# Patient Record
Sex: Male | Born: 1950 | Race: White | Hispanic: No | Marital: Married | Smoking: Former smoker
Health system: Southern US, Community
[De-identification: ages and names within clinical notes are randomized; demographics above are authoritative.]

## PROBLEM LIST (undated history)

## (undated) DIAGNOSIS — M419 Scoliosis, unspecified: Secondary | ICD-10-CM

## (undated) DIAGNOSIS — N200 Calculus of kidney: Secondary | ICD-10-CM

## (undated) DIAGNOSIS — R7989 Other specified abnormal findings of blood chemistry: Secondary | ICD-10-CM

## (undated) DIAGNOSIS — I10 Essential (primary) hypertension: Secondary | ICD-10-CM

## (undated) DIAGNOSIS — R0602 Shortness of breath: Secondary | ICD-10-CM

## (undated) DIAGNOSIS — G629 Polyneuropathy, unspecified: Secondary | ICD-10-CM

## (undated) DIAGNOSIS — R0789 Other chest pain: Secondary | ICD-10-CM

## (undated) DIAGNOSIS — M1990 Unspecified osteoarthritis, unspecified site: Secondary | ICD-10-CM

## (undated) DIAGNOSIS — C44300 Unspecified malignant neoplasm of skin of unspecified part of face: Secondary | ICD-10-CM

## (undated) DIAGNOSIS — F419 Anxiety disorder, unspecified: Secondary | ICD-10-CM

## (undated) DIAGNOSIS — G20 Parkinson's disease: Secondary | ICD-10-CM

## (undated) HISTORY — PX: SUBTHALAMIC STIMULATOR BATTERY REPLACEMENT: SHX5405

## (undated) HISTORY — PX: HEMORROIDECTOMY: SUR656

## (undated) HISTORY — PX: TONSILLECTOMY: SUR1361

## (undated) HISTORY — DX: Polyneuropathy, unspecified: G62.9

## (undated) HISTORY — PX: BACK SURGERY: SHX140

## (undated) HISTORY — DX: Anxiety disorder, unspecified: F41.9

## (undated) HISTORY — PX: BURR HOLE W/ STEREOTACTIC INSERTION OF DBS LEADS / INTRAOP MICROELECTRODE RECORDING: SUR171

## (undated) HISTORY — DX: Calculus of kidney: N20.0

## (undated) HISTORY — DX: Unspecified osteoarthritis, unspecified site: M19.90

## (undated) HISTORY — DX: Essential (primary) hypertension: I10

## (undated) HISTORY — DX: Parkinson's disease: G20

## (undated) HISTORY — DX: Scoliosis, unspecified: M41.9

## (undated) HISTORY — PX: UPPER GI ENDOSCOPY: SHX6162

## (undated) HISTORY — PX: COLONOSCOPY: SHX5424

---

## 2004-03-05 ENCOUNTER — Ambulatory Visit: Payer: Self-pay

## 2004-05-15 ENCOUNTER — Ambulatory Visit: Payer: Self-pay | Admitting: Pain Medicine

## 2004-05-27 ENCOUNTER — Ambulatory Visit: Payer: Self-pay | Admitting: Pain Medicine

## 2004-06-10 ENCOUNTER — Ambulatory Visit: Payer: Self-pay | Admitting: Pain Medicine

## 2004-06-23 ENCOUNTER — Ambulatory Visit: Payer: Self-pay | Admitting: Physician Assistant

## 2004-10-30 ENCOUNTER — Ambulatory Visit: Payer: Self-pay | Admitting: Family Medicine

## 2004-11-13 ENCOUNTER — Ambulatory Visit: Payer: Self-pay | Admitting: Pain Medicine

## 2004-11-28 ENCOUNTER — Ambulatory Visit: Payer: Self-pay | Admitting: Physician Assistant

## 2004-12-09 ENCOUNTER — Ambulatory Visit: Payer: Self-pay | Admitting: Pain Medicine

## 2005-01-08 ENCOUNTER — Ambulatory Visit: Payer: Self-pay | Admitting: Pain Medicine

## 2005-01-29 ENCOUNTER — Ambulatory Visit: Payer: Self-pay | Admitting: Pain Medicine

## 2005-03-03 ENCOUNTER — Ambulatory Visit: Payer: Self-pay | Admitting: Pain Medicine

## 2005-03-17 ENCOUNTER — Ambulatory Visit: Payer: Self-pay | Admitting: Pain Medicine

## 2005-03-25 ENCOUNTER — Ambulatory Visit: Payer: Self-pay | Admitting: Pain Medicine

## 2005-04-01 ENCOUNTER — Ambulatory Visit: Payer: Self-pay | Admitting: Pain Medicine

## 2005-06-08 ENCOUNTER — Ambulatory Visit: Payer: Self-pay | Admitting: Pain Medicine

## 2006-05-18 ENCOUNTER — Ambulatory Visit: Payer: Self-pay | Admitting: Internal Medicine

## 2006-06-07 ENCOUNTER — Ambulatory Visit: Payer: Self-pay | Admitting: Internal Medicine

## 2006-09-07 ENCOUNTER — Ambulatory Visit: Payer: Self-pay | Admitting: Gastroenterology

## 2006-11-30 ENCOUNTER — Ambulatory Visit: Payer: Self-pay | Admitting: Internal Medicine

## 2006-12-22 ENCOUNTER — Ambulatory Visit: Payer: Self-pay | Admitting: Internal Medicine

## 2007-05-02 ENCOUNTER — Ambulatory Visit: Payer: Self-pay | Admitting: Urology

## 2007-05-02 ENCOUNTER — Ambulatory Visit: Payer: Self-pay

## 2007-05-06 ENCOUNTER — Ambulatory Visit: Payer: Self-pay | Admitting: Internal Medicine

## 2007-07-28 ENCOUNTER — Ambulatory Visit: Payer: Self-pay | Admitting: Internal Medicine

## 2007-07-29 ENCOUNTER — Ambulatory Visit: Payer: Self-pay | Admitting: Internal Medicine

## 2007-08-01 ENCOUNTER — Ambulatory Visit: Payer: Self-pay | Admitting: Internal Medicine

## 2007-09-22 ENCOUNTER — Ambulatory Visit: Payer: Self-pay | Admitting: Vascular Surgery

## 2007-10-31 ENCOUNTER — Ambulatory Visit: Payer: Self-pay | Admitting: Internal Medicine

## 2008-02-27 ENCOUNTER — Ambulatory Visit: Payer: Self-pay | Admitting: Internal Medicine

## 2008-03-15 ENCOUNTER — Ambulatory Visit: Payer: Self-pay | Admitting: Internal Medicine

## 2008-04-06 ENCOUNTER — Ambulatory Visit: Payer: Self-pay | Admitting: Vascular Surgery

## 2008-08-28 ENCOUNTER — Inpatient Hospital Stay (HOSPITAL_COMMUNITY): Admission: RE | Admit: 2008-08-28 | Discharge: 2008-08-30 | Payer: Self-pay | Admitting: Neurosurgery

## 2008-09-17 ENCOUNTER — Emergency Department: Payer: Self-pay | Admitting: Emergency Medicine

## 2008-11-02 ENCOUNTER — Ambulatory Visit: Payer: Self-pay | Admitting: Surgery

## 2008-11-09 ENCOUNTER — Ambulatory Visit: Payer: Self-pay | Admitting: Surgery

## 2009-01-07 ENCOUNTER — Ambulatory Visit: Payer: Self-pay | Admitting: Neurosurgery

## 2009-01-29 ENCOUNTER — Other Ambulatory Visit: Payer: Self-pay | Admitting: Physician Assistant

## 2009-02-15 ENCOUNTER — Ambulatory Visit: Payer: Self-pay | Admitting: Neurosurgery

## 2009-03-12 ENCOUNTER — Encounter: Admission: RE | Admit: 2009-03-12 | Discharge: 2009-03-12 | Payer: Self-pay | Admitting: Neurosurgery

## 2009-07-09 ENCOUNTER — Other Ambulatory Visit: Payer: Self-pay | Admitting: Internal Medicine

## 2009-08-16 ENCOUNTER — Ambulatory Visit: Payer: Self-pay | Admitting: Vascular Surgery

## 2010-01-15 ENCOUNTER — Other Ambulatory Visit: Payer: Self-pay | Admitting: Internal Medicine

## 2010-01-15 LAB — CBC WITH DIFFERENTIAL/PLATELET
Basophil #: 0 x10 (ref 0.0–0.1)
Basophil %: 0.5 %
Eosinophil #: 0.1 x10 (ref 0.0–0.7)
HCT: 47.1 % (ref 40.0–52.0)
HGB: 16.1 g/dL (ref 13.0–18.0)
Lymphocyte #: 1.5 x10 (ref 1.0–3.6)
Lymphocyte %: 33.1 %
MCH: 31 pg (ref 26.0–34.0)
MCHC: 34.2 g/dL (ref 32.0–36.0)
Monocyte #: 0.5 x10 (ref 0.0–0.7)
Neutrophil #: 2.5 x10 (ref 1.4–6.5)
Platelet: 169 x10 (ref 150–440)
RDW: 13.6 % (ref 11.5–14.5)

## 2010-01-15 LAB — COMPREHENSIVE METABOLIC PANEL
Albumin: 3.9 g/dL (ref 3.4–5.0)
Anion Gap: 10 (ref 7–16)
BUN: 19 mg/dL — ABNORMAL HIGH (ref 7–18)
Chloride: 101 mmol/L (ref 98–107)
Co2: 33 mmol/L — ABNORMAL HIGH (ref 21–32)
EGFR (African American): >60
EGFR (Non-African Amer.): >60
Glucose: 92 mg/dL (ref 65–99)
SGOT(AST): 18 U/L (ref 15–37)
SGPT (ALT): <6 U/L — ABNORMAL LOW (ref 30–79)

## 2010-01-15 LAB — LIPID PANEL
HDL Cholesterol: 60 mg/dL (ref 40–60)
Ldl Cholesterol, Calc: 96 mg/dL (ref 0–100)
Triglycerides: 61 mg/dL (ref 0–200)
VLDL Cholesterol, Calc: 12 mg/dL (ref 5–40)

## 2010-01-15 LAB — TSH: Thyroid Stimulating Horm: 2.41 mcIU/mL

## 2010-01-16 LAB — PSA

## 2010-02-26 ENCOUNTER — Ambulatory Visit: Payer: Self-pay | Admitting: Neurosurgery

## 2010-03-04 ENCOUNTER — Ambulatory Visit: Payer: Self-pay | Admitting: Pain Medicine

## 2010-03-17 ENCOUNTER — Ambulatory Visit: Payer: Self-pay | Admitting: Pain Medicine

## 2010-03-18 ENCOUNTER — Ambulatory Visit: Payer: Self-pay | Admitting: Pain Medicine

## 2010-04-02 ENCOUNTER — Ambulatory Visit: Payer: Self-pay | Admitting: Pain Medicine

## 2010-04-03 ENCOUNTER — Ambulatory Visit: Payer: Self-pay | Admitting: Pain Medicine

## 2010-04-22 ENCOUNTER — Inpatient Hospital Stay (HOSPITAL_COMMUNITY)
Admission: RE | Admit: 2010-04-22 | Discharge: 2010-04-23 | Payer: Self-pay | Source: Home / Self Care | Admitting: Neurosurgery

## 2010-05-30 ENCOUNTER — Ambulatory Visit: Payer: Self-pay | Admitting: Neurosurgery

## 2010-06-17 ENCOUNTER — Ambulatory Visit: Payer: Self-pay | Admitting: Pain Medicine

## 2010-06-21 ENCOUNTER — Encounter: Payer: Self-pay | Admitting: Neurosurgery

## 2010-06-30 ENCOUNTER — Ambulatory Visit: Payer: Self-pay | Admitting: Pain Medicine

## 2010-07-15 ENCOUNTER — Other Ambulatory Visit: Payer: Self-pay | Admitting: Internal Medicine

## 2010-07-18 ENCOUNTER — Ambulatory Visit: Payer: Self-pay | Admitting: Internal Medicine

## 2010-07-30 ENCOUNTER — Encounter: Payer: Self-pay | Admitting: Neurology

## 2010-07-31 ENCOUNTER — Encounter: Payer: Self-pay | Admitting: Neurology

## 2010-08-12 LAB — PROTIME-INR
INR: 1.06 (ref 0.00–1.49)
Prothrombin Time: 14 seconds (ref 11.6–15.2)

## 2010-08-12 LAB — APTT: aPTT: 30 seconds (ref 24–37)

## 2010-08-12 LAB — SURGICAL PCR SCREEN: MRSA, PCR: NEGATIVE

## 2010-08-12 LAB — URINALYSIS, ROUTINE W REFLEX MICROSCOPIC
Ketones, ur: 15 mg/dL — AB
Nitrite: NEGATIVE
Protein, ur: NEGATIVE mg/dL
Specific Gravity, Urine: 1.024 (ref 1.005–1.030)
Urobilinogen, UA: 0.2 mg/dL (ref 0.0–1.0)

## 2010-08-12 LAB — COMPREHENSIVE METABOLIC PANEL
Alkaline Phosphatase: 82 U/L (ref 39–117)
BUN: 21 mg/dL (ref 6–23)
Calcium: 9.9 mg/dL (ref 8.4–10.5)
GFR calc non Af Amer: 50 mL/min — ABNORMAL LOW (ref >60)
Glucose, Bld: 104 mg/dL — ABNORMAL HIGH (ref 70–99)
Potassium: 3.6 mEq/L (ref 3.5–5.1)
Total Protein: 7.1 g/dL (ref 6.0–8.3)

## 2010-08-12 LAB — DIFFERENTIAL
Basophils Relative: 0 % (ref 0–1)
Monocytes Relative: 11 % (ref 3–12)
Neutro Abs: 4.1 10*3/uL (ref 1.7–7.7)
Neutrophils Relative %: 63 % (ref 43–77)

## 2010-08-12 LAB — CBC
HCT: 46.1 % (ref 39.0–52.0)
MCHC: 34.7 g/dL (ref 30.0–36.0)
MCV: 88.3 fL (ref 78.0–100.0)
RDW: 13.1 % (ref 11.5–15.5)

## 2010-08-31 ENCOUNTER — Encounter: Payer: Self-pay | Admitting: Neurology

## 2010-09-11 LAB — URINALYSIS, ROUTINE W REFLEX MICROSCOPIC
Bilirubin Urine: NEGATIVE
Ketones, ur: 15 mg/dL — AB
Nitrite: NEGATIVE
Specific Gravity, Urine: 1.023 (ref 1.005–1.030)
Urobilinogen, UA: 0.2 mg/dL (ref 0.0–1.0)
pH: 6 (ref 5.0–8.0)

## 2010-09-11 LAB — ABO/RH: ABO/RH(D): O POS

## 2010-09-11 LAB — DIFFERENTIAL
Basophils Absolute: 0 10*3/uL (ref 0.0–0.1)
Lymphocytes Relative: 34 % (ref 12–46)
Monocytes Absolute: 0.5 10*3/uL (ref 0.1–1.0)
Monocytes Relative: 10 % (ref 3–12)
Neutro Abs: 2.5 10*3/uL (ref 1.7–7.7)
Neutrophils Relative %: 54 % (ref 43–77)

## 2010-09-11 LAB — URINE MICROSCOPIC-ADD ON

## 2010-09-11 LAB — CBC
HCT: 47 % (ref 39.0–52.0)
Hemoglobin: 16.5 g/dL (ref 13.0–17.0)
MCHC: 35 g/dL (ref 30.0–36.0)
MCV: 91.8 fL (ref 78.0–100.0)
Platelets: 194 10*3/uL (ref 150–400)
RDW: 12.5 % (ref 11.5–15.5)

## 2010-09-11 LAB — COMPREHENSIVE METABOLIC PANEL
Albumin: 3.9 g/dL (ref 3.5–5.2)
BUN: 13 mg/dL (ref 6–23)
Creatinine, Ser: 1.27 mg/dL (ref 0.4–1.5)
Glucose, Bld: 98 mg/dL (ref 70–99)
Total Protein: 6.4 g/dL (ref 6.0–8.3)

## 2010-09-11 LAB — APTT: aPTT: 32 seconds (ref 24–37)

## 2010-09-11 LAB — PROTIME-INR: INR: 1.1 (ref 0.00–1.49)

## 2010-09-11 LAB — TYPE AND SCREEN: ABO/RH(D): O POS

## 2010-10-14 NOTE — H&P (Signed)
Hunter Brown, Hunter Brown                   ACCOUNT NO.:  [id number]   MEDICAL RECORD NO.:  [id number]          PATIENT TYPE:  INP   LOCATION:  3008                         FACILITY:  [facility]   PHYSICIAN:  Payton Doughty, M.D.      DATE OF BIRTH:  April 25, 1951   DATE OF ADMISSION:  08/28/2008  DATE OF DISCHARGE:                              HISTORY & PHYSICAL   ADMISSION DIAGNOSIS:  Spondylosis at L3-4.   BODY OF TEXT:  A very nice 60 year old right-handed white gentleman we  had seen several years ago with pain in his back.  He has had increased  pain in back down his left leg.  Myelogram in November demonstrated disk  bulging at L3-4.  He subsequently had an epidural injection that did not  help at L4-5, and then got a facet block at L3-4 on the left and this  provided relief transiently for about as long as the lidocaine was  there.  We assume then that the pathologic levels are L3-4 and he is now  here for an anterolateral interbody fusion at L3-4.  Medical history is  remarkable for Parkinson disease.  He has a deep brain stimulator and he  takes Stalevo, amantadine, Azilect, Colace, Flexeril, Flonase, and  Xanax.  He has no allergies.   SURGICAL HISTORY:  Brain stimulator, tonsillectomy, and hemorrhoids.   SOCIAL HISTORY:  Does not smoke, does not drink, is on disability.   FAMILY HISTORY:  Mom died at 53.  Dad died at 55 of cardiac disease and  Alzheimer's.   REVIEW OF SYSTEMS:  Remarkable for glasses, swelling in hands and feet,  leg pain, shortness of breath, skin cancer, and fainting spells.   PHYSICAL EXAMINATION:  HEENT:  Within normal limits.  NECK:  He has reasonable range of motion in neck.  CHEST:  Clear.  CARDIAC:  Regular rate and rhythm.  ABDOMEN:  Nontender with no hepatosplenomegaly.  EXTREMITIES:  Without clubbing or cyanosis.  Peripheral pulses are good.  GU:  Deferred.  NEUROLOGICAL:  He is awake, alert, and oriented.  Cranial nerves are  intact.  Motor exam shows  5/5 strength throughout the upper and lower  extremities.  Pulling causes back pain, pushing does not.  Reflexes are  1 at the knees and 1 at the ankles.   Radiographic results have been reviewed above.   CLINICAL IMPRESSION:  Lumbar spondylosis at L3-4.   PLAN:  L3-4 XLIF.  The risks and benefits have been discussed with him.  He wished to proceed.            ______________________________  Payton Doughty, M.D.     MWR/MEDQ  D:  08/28/2008  T:  08/29/2008  Job:  [id number]

## 2010-10-14 NOTE — Op Note (Signed)
Hunter Brown, Hunter Brown                   ACCOUNT NO.:  [id number]   MEDICAL RECORD NO.:  [id number]          PATIENT TYPE:  INP   LOCATION:  3008                         FACILITY:  [facility]   PHYSICIAN:  Payton Doughty, M.D.      DATE OF BIRTH:  12-05-1950   DATE OF PROCEDURE:  08/28/2008  DATE OF DISCHARGE:                               OPERATIVE REPORT   PREOPERATIVE DIAGNOSIS:  Spondylosis L3-4.   POSTOPERATIVE DIAGNOSIS:  Spondylosis L3-4.   OPERATIVE PROCEDURE:  L3-4 anterolateral interbody fusion using XLIF  technique.   SURGEON:  Payton Doughty, MD.   ANESTHESIA:  General endotracheal.   PREPARATION:  Prepped and draped with alcohol wipe.   COMPLICATIONS:  None.   NURSE ASSISTANT:  [place].   DOCTOR ASSISTANT:  Coletta Memos, MD   BODY OF TEXT:  A 60 year old gentleman with severe spondylosis and  lateral slip at L3-4 taken to operative room, smoothly anesthetized,  intubated, and placed in the right side down left side up lateral  decubitus position.  The table flexed so that fluoroscopic visualization  of the L3-4 interspace was achieved.  Following shave, prep, and drape  in usual sterile fashion, 2 incisions were made, a small posterior  incision and one directly lateral that was about 3 cm long.  Through  these incisions, the retroperitoneal space was accessed.  The dilator  probe was passed through the lateral incision down to the psoas muscle  and to the intervertebral space at L3-4.  Electromonitoring was carried  out to ensure that there was no injury to the nerve roots or pelvic  plexus.  Following successive dilation, a retractor was placed, the shim  tapped into the disk space.  Diskectomy was carried out with a variety  of instrumentation and the endplates prepared.  A 10-mm x 55-mm  interbody graft with Osteocel was placed.  The lateral plate was placed  with screws in L3 and L4.  This was also done under fluoroscopic  control.  The locking caps were placed and  tightened.  Intraoperative  fluoro showed good placement of interbody device, screws, and lateral  plate.  A 0 Vicryl and 3-0 nylon were used to close.  Betadine and Telfa  dressing were applied and the patient returned to recovery room in good  condition.            ______________________________  Payton Doughty, M.D.    MWR/MEDQ  D:  08/28/2008  T:  08/29/2008  Job:  [id number]

## 2010-11-04 ENCOUNTER — Other Ambulatory Visit: Payer: Self-pay | Admitting: Neurosurgery

## 2010-11-04 DIAGNOSIS — M545 Low back pain: Secondary | ICD-10-CM

## 2010-11-19 ENCOUNTER — Ambulatory Visit: Payer: Self-pay | Admitting: Neurosurgery

## 2011-01-20 ENCOUNTER — Ambulatory Visit: Payer: Self-pay | Admitting: Pain Medicine

## 2011-01-27 ENCOUNTER — Ambulatory Visit: Payer: Self-pay | Admitting: Pain Medicine

## 2011-02-11 ENCOUNTER — Ambulatory Visit: Payer: Self-pay | Admitting: Pain Medicine

## 2011-04-27 ENCOUNTER — Other Ambulatory Visit: Payer: Self-pay | Admitting: Internal Medicine

## 2011-04-28 LAB — PSA: PSA: 1.1 ng/mL (ref 0.0–4.0)

## 2011-08-18 ENCOUNTER — Other Ambulatory Visit: Payer: Self-pay | Admitting: Internal Medicine

## 2011-08-18 LAB — RENAL FUNCTION PANEL
Albumin: 3.9 g/dL (ref 3.4–5.0)
Anion Gap: 10 (ref 7–16)
Calcium, Total: 9 mg/dL (ref 8.5–10.1)
Co2: 35 mmol/L — ABNORMAL HIGH (ref 21–32)
Creatinine: 1.5 mg/dL — ABNORMAL HIGH (ref 0.60–1.30)
EGFR (African American): >60
EGFR (Non-African Amer.): 50 — ABNORMAL LOW
Glucose: 105 mg/dL — ABNORMAL HIGH (ref 65–99)
Osmolality: 291 (ref 275–301)

## 2011-10-29 ENCOUNTER — Ambulatory Visit: Payer: Self-pay | Admitting: Pain Medicine

## 2011-11-12 ENCOUNTER — Ambulatory Visit: Payer: Self-pay | Admitting: Pain Medicine

## 2011-12-08 ENCOUNTER — Ambulatory Visit: Payer: Self-pay | Admitting: Pain Medicine

## 2011-12-15 ENCOUNTER — Ambulatory Visit: Payer: Self-pay | Admitting: Pain Medicine

## 2012-01-04 IMAGING — CT CT CERVICAL SPINE WITHOUT CONTRAST
4 of 5 series · 15 of 33 positions shown, 17 images · non-contrast
Comparison: none

REASON FOR EXAM: NECK R SHOULDER AND ARM PAIN
COMMENTS:

[Series 3: axial · axial · 0.23mm/px · z∈[-211,-122]mm · 3 of 60 slices shown, 4 images]
[im 15/60  soft-tissue]
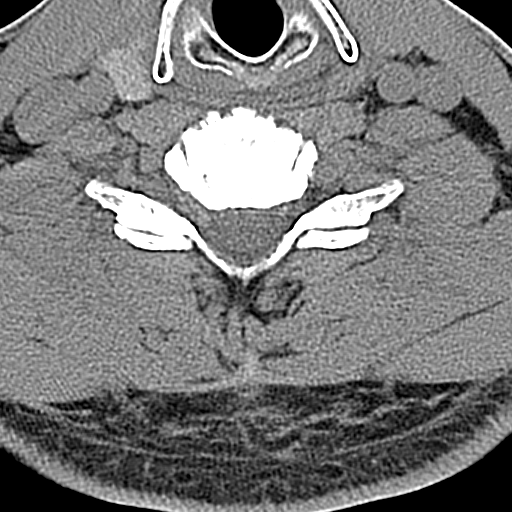
[im 15/60  bone]
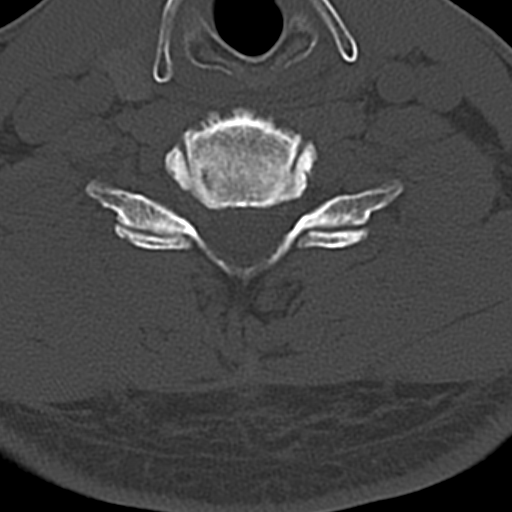
[im 30/60  bone]
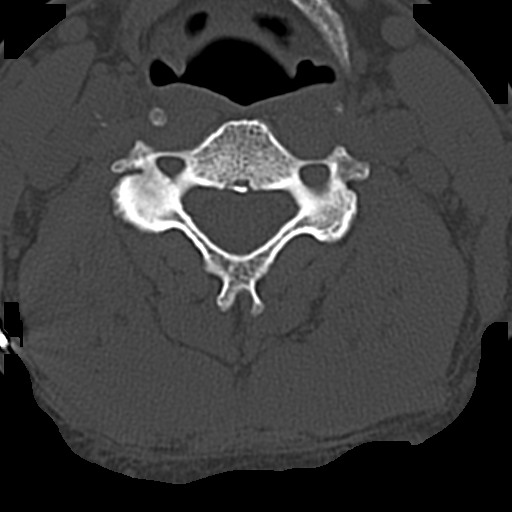
[im 45/60  bone]
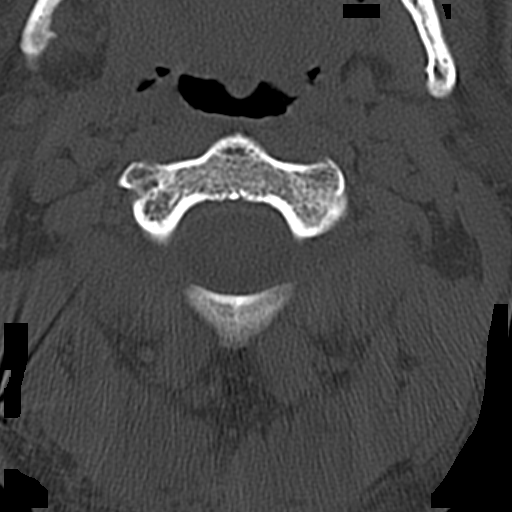

[Series 7: axial soft tissue · axial · 0.23mm/px · z∈[-236,-146]mm · 5 of 93 slices shown]
[im 12/93  soft-tissue]
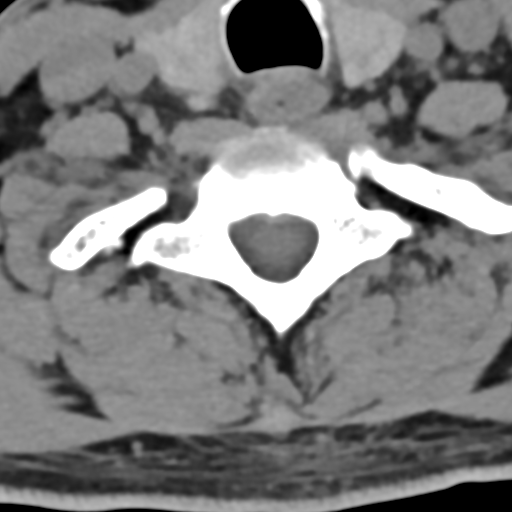
[im 24/93  soft-tissue]
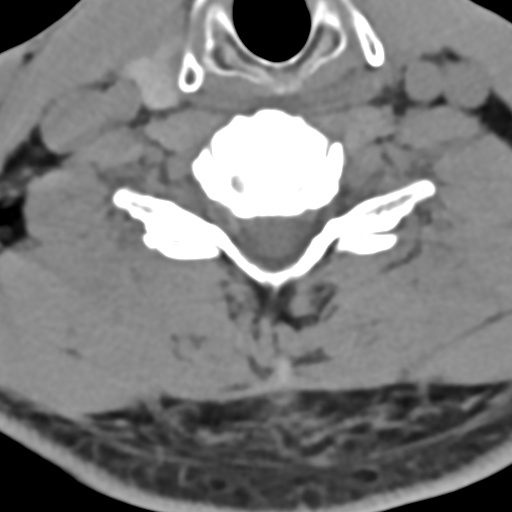
[im 35/93  soft-tissue]
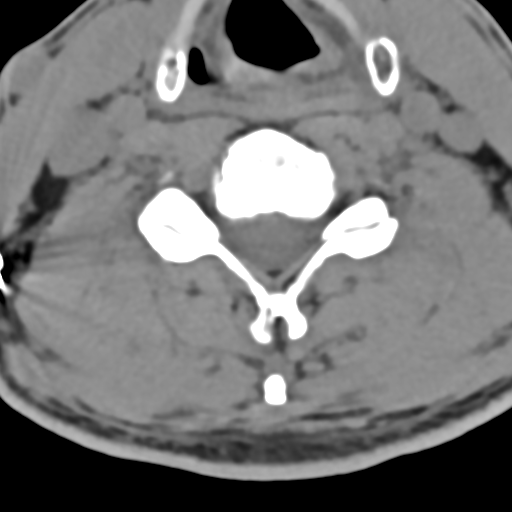
[im 47/93  soft-tissue]
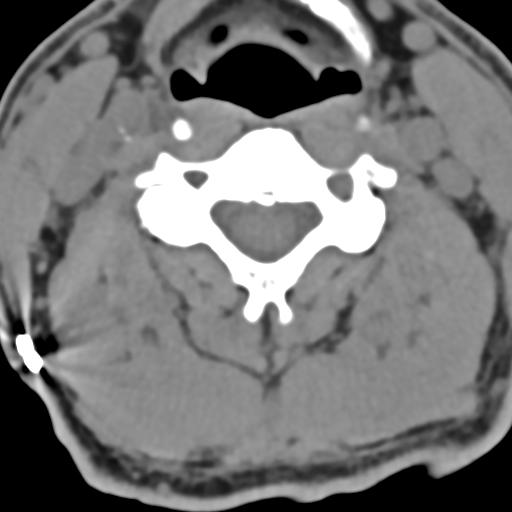
[im 58/93  soft-tissue]
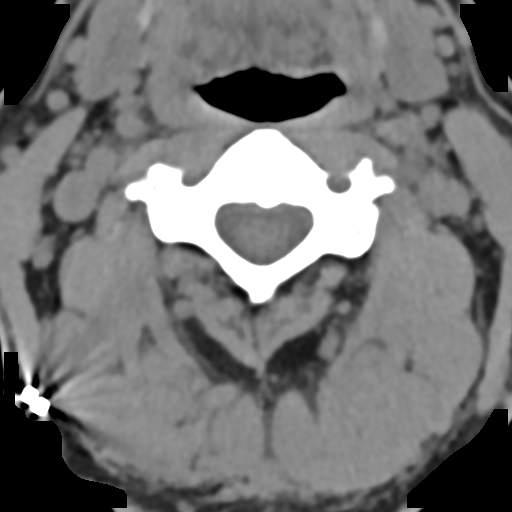

[Series 8: coronal soft tissue · coronal · 0.27mm/px · 2 of 42 slices shown]
[im 14/42  bone]
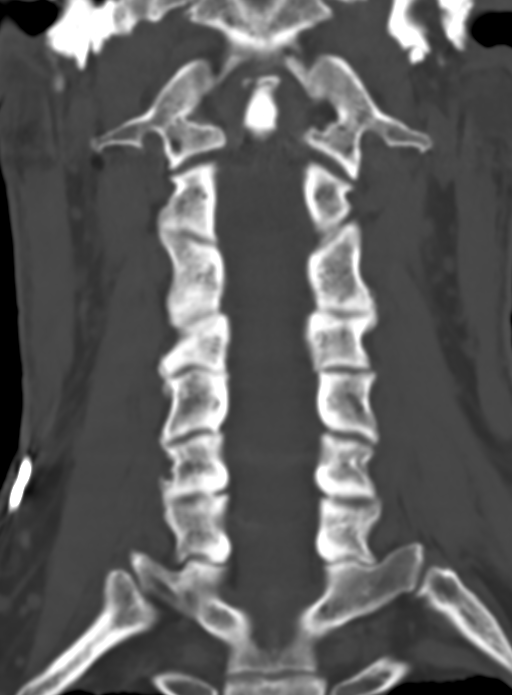
[im 28/42  bone]
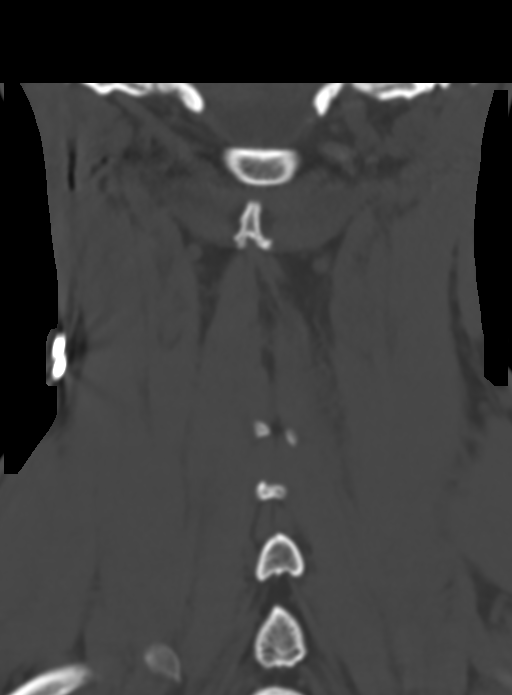

[Series 9: sagittal soft tissue · sagittal · 0.27mm/px · 5 of 47 slices shown, 6 images]
[im 16/47  bone]
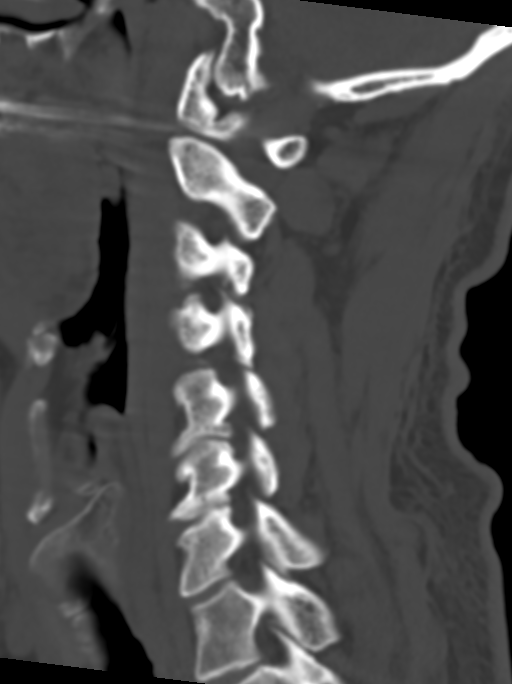
[im 20/47  bone]
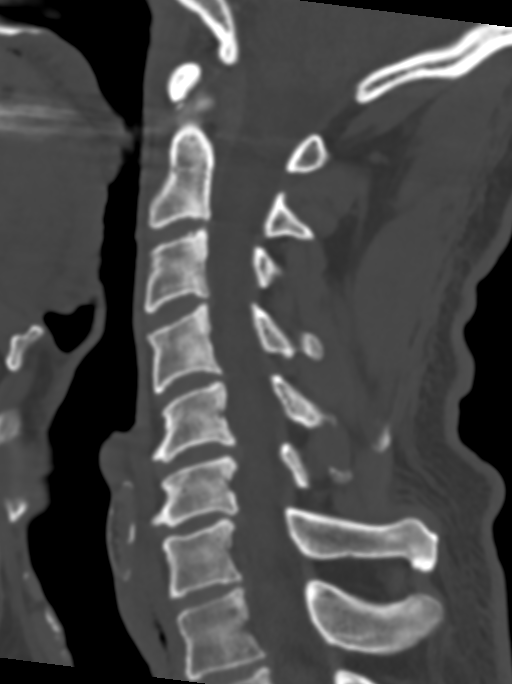
[im 24/47  soft-tissue]
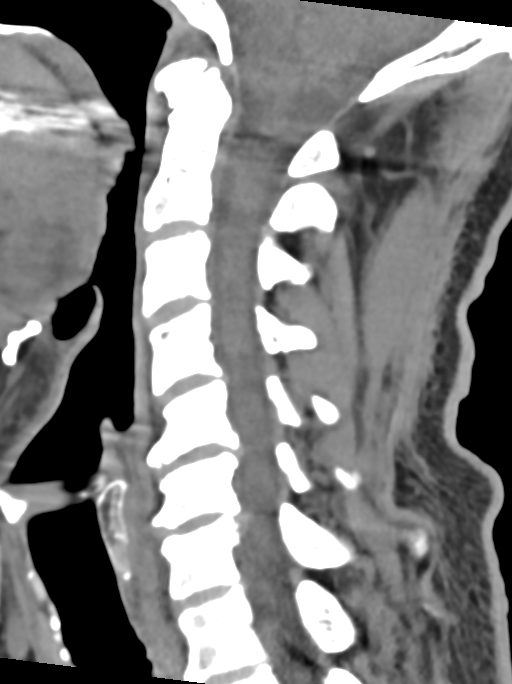
[im 24/47  bone]
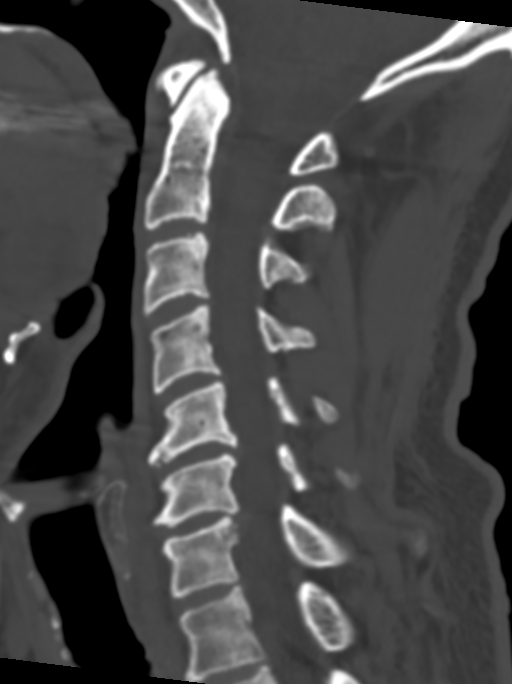
[im 27/47  bone]
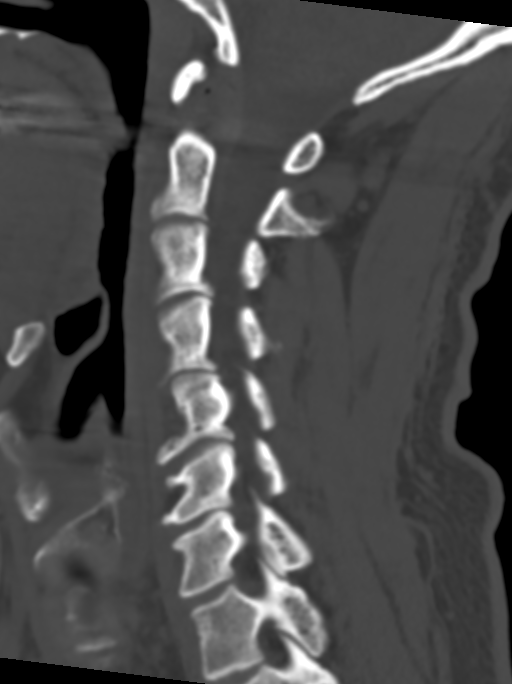
[im 31/47  bone]
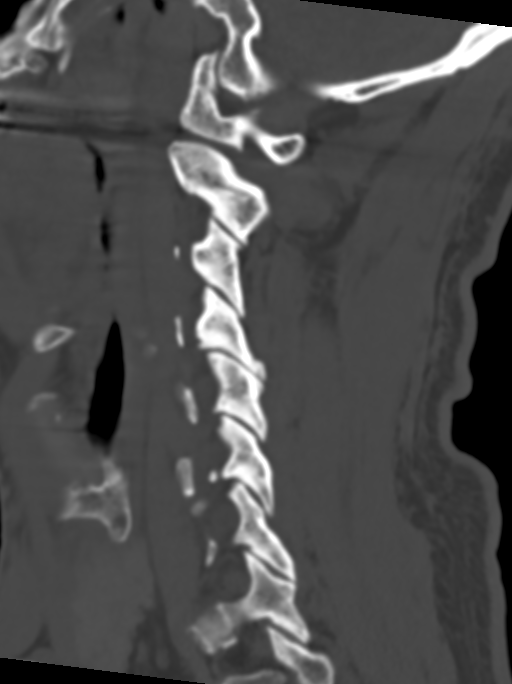

[15 of 33 positions shown; findings below may reference images not displayed]

PROCEDURE:     KCT - KCT CERVICAL SPINE WO CONTRAST  - February 26, 2010  [DATE]

RESULT:

Multiplanar imaging of the cervical spine was obtained utilizing helical 3
mm acquisition and bone as well as soft tissue algorithm reconstruction.

Evaluation of the osseous structures demonstrates no evidence of fracture
nor dislocation. There is mild straightening of the normal cervical
lordosis. Osteoarthritic changes are appreciated at the C5-6 level evident
by endplate hypertrophic spurring. Degenerative changes are also identified
at C6-C7 with similar findings as well as disc space narrowing.

At the C6-7 level there is partial ossification of the posterior
longitudinal ligament. This is minimal. Disc bulges are appreciated at the
C5-C6 and C6-C7 level causing partial effacement of the anterior CSF space
and mild thecal sac narrowing.

The neural foramen grossly appears patent. Evaluation of the soft tissue
windowing demonstrates no gross abnormalities.
IMPRESSION: 1. Mild disc bulges at C5-C6 and C6-C7 causing partial effacement of the
anterior CSF space.
2. Area of partial, mild ossification of the posterior longitudinal ligament
at C6-C7.

## 2012-01-25 ENCOUNTER — Ambulatory Visit: Payer: Self-pay | Admitting: Pain Medicine

## 2012-01-29 ENCOUNTER — Ambulatory Visit: Payer: Self-pay | Admitting: Pain Medicine

## 2012-02-04 ENCOUNTER — Ambulatory Visit: Payer: Self-pay | Admitting: Pain Medicine

## 2012-02-22 ENCOUNTER — Ambulatory Visit: Payer: Self-pay | Admitting: Pain Medicine

## 2012-02-22 ENCOUNTER — Other Ambulatory Visit: Payer: Self-pay | Admitting: Physician Assistant

## 2012-02-22 LAB — COMPREHENSIVE METABOLIC PANEL
Albumin: 3.9 g/dL (ref 3.4–5.0)
BUN: 26 mg/dL — ABNORMAL HIGH (ref 7–18)
Bilirubin,Total: 0.4 mg/dL (ref 0.2–1.0)
Glucose: 112 mg/dL — ABNORMAL HIGH (ref 65–99)
Osmolality: 298 (ref 275–301)
SGOT(AST): 19 U/L (ref 15–37)
SGPT (ALT): 11 U/L — ABNORMAL LOW (ref 12–78)
Total Protein: 7.2 g/dL (ref 6.4–8.2)

## 2012-02-22 LAB — CBC WITH DIFFERENTIAL/PLATELET
Basophil #: 0 10*3/uL (ref 0.0–0.1)
Basophil %: 0.5 %
Eosinophil #: 0.1 10*3/uL (ref 0.0–0.7)
HGB: 13.6 g/dL (ref 13.0–18.0)
Lymphocyte %: 26.1 %
MCH: 31.7 pg (ref 26.0–34.0)
MCHC: 35.5 g/dL (ref 32.0–36.0)
Monocyte #: 0.6 x10 3/mm (ref 0.2–1.0)
Neutrophil #: 3.5 10*3/uL (ref 1.4–6.5)
Platelet: 163 10*3/uL (ref 150–440)
RBC: 4.29 10*6/uL — ABNORMAL LOW (ref 4.40–5.90)
RDW: 13.7 % (ref 11.5–14.5)
WBC: 5.7 10*3/uL (ref 3.8–10.6)

## 2012-02-22 LAB — TSH: Thyroid Stimulating Horm: 2.33 u[IU]/mL

## 2012-03-07 ENCOUNTER — Ambulatory Visit: Payer: Self-pay | Admitting: Pain Medicine

## 2012-03-21 ENCOUNTER — Other Ambulatory Visit: Payer: Self-pay

## 2012-05-16 ENCOUNTER — Other Ambulatory Visit: Payer: Self-pay | Admitting: Internal Medicine

## 2012-05-16 LAB — COMPREHENSIVE METABOLIC PANEL
Alkaline Phosphatase: 95 U/L (ref 50–136)
BUN: 25 mg/dL — ABNORMAL HIGH (ref 7–18)
Bilirubin,Total: 0.6 mg/dL (ref 0.2–1.0)
Chloride: 104 mmol/L (ref 98–107)
Co2: 35 mmol/L — ABNORMAL HIGH (ref 21–32)
Creatinine: 1.42 mg/dL — ABNORMAL HIGH (ref 0.60–1.30)
EGFR (African American): >60
EGFR (Non-African Amer.): 53 — ABNORMAL LOW
Glucose: 93 mg/dL (ref 65–99)
Osmolality: 291 (ref 275–301)
SGPT (ALT): 8 U/L — ABNORMAL LOW (ref 12–78)
Sodium: 144 mmol/L (ref 136–145)

## 2012-05-16 LAB — URINALYSIS, COMPLETE
Glucose,UR: NEGATIVE mg/dL (ref 0–75)
Hyaline Cast: 46
Leukocyte Esterase: NEGATIVE
Nitrite: NEGATIVE
Protein: NEGATIVE
RBC,UR: 1 /HPF (ref 0–5)
WBC UR: 2 /HPF (ref 0–5)

## 2012-05-16 LAB — CBC WITH DIFFERENTIAL/PLATELET
Basophil #: 0 10*3/uL (ref 0.0–0.1)
Basophil %: 0.7 %
Eosinophil #: 0.1 10*3/uL (ref 0.0–0.7)
HGB: 13.5 g/dL (ref 13.0–18.0)
Lymphocyte %: 34.2 %
Monocyte #: 0.5 x10 3/mm (ref 0.2–1.0)
Monocyte %: 12 %
Neutrophil #: 2.1 10*3/uL (ref 1.4–6.5)
Platelet: 163 10*3/uL (ref 150–440)
RDW: 12.7 % (ref 11.5–14.5)
WBC: 4.1 10*3/uL (ref 3.8–10.6)

## 2012-05-16 LAB — LIPID PANEL
Cholesterol: 156 mg/dL (ref 0–200)
HDL Cholesterol: 58 mg/dL (ref 40–60)
Triglycerides: 35 mg/dL (ref 0–200)
VLDL Cholesterol, Calc: 7 mg/dL (ref 5–40)

## 2012-05-16 LAB — HEMOGLOBIN A1C: Hemoglobin A1C: 5.4 % (ref 4.2–6.3)

## 2012-07-13 ENCOUNTER — Other Ambulatory Visit: Payer: Self-pay | Admitting: Neurosurgery

## 2012-07-13 DIAGNOSIS — M47817 Spondylosis without myelopathy or radiculopathy, lumbosacral region: Secondary | ICD-10-CM

## 2012-07-15 ENCOUNTER — Other Ambulatory Visit: Payer: Self-pay

## 2012-07-18 ENCOUNTER — Ambulatory Visit
Admission: RE | Admit: 2012-07-18 | Discharge: 2012-07-18 | Disposition: A | Discharge status: Home / Self Care | Payer: 59 | Source: Ambulatory Visit | Attending: Neurosurgery | Admitting: Neurosurgery

## 2012-07-18 DIAGNOSIS — M47817 Spondylosis without myelopathy or radiculopathy, lumbosacral region: Secondary | ICD-10-CM

## 2012-11-06 ENCOUNTER — Emergency Department: Payer: Self-pay | Admitting: Emergency Medicine

## 2012-11-06 LAB — COMPREHENSIVE METABOLIC PANEL
Alkaline Phosphatase: 96 U/L (ref 50–136)
Anion Gap: 5 — ABNORMAL LOW (ref 7–16)
BUN: 21 mg/dL — ABNORMAL HIGH (ref 7–18)
Bilirubin,Total: 0.4 mg/dL (ref 0.2–1.0)
Calcium, Total: 8.7 mg/dL (ref 8.5–10.1)
Chloride: 101 mmol/L (ref 98–107)
EGFR (African American): 55 — ABNORMAL LOW
Glucose: 146 mg/dL — ABNORMAL HIGH (ref 65–99)
SGPT (ALT): 7 U/L — ABNORMAL LOW (ref 12–78)

## 2012-11-06 LAB — CBC
MCHC: 34.4 g/dL (ref 32.0–36.0)
MCV: 87 fL (ref 80–100)
Platelet: 191 10*3/uL (ref 150–440)
RBC: 4.51 10*6/uL (ref 4.40–5.90)
RDW: 13.6 % (ref 11.5–14.5)
WBC: 4.3 10*3/uL (ref 3.8–10.6)

## 2012-11-06 LAB — TROPONIN I: Troponin-I: <0.02 ng/mL

## 2012-11-16 ENCOUNTER — Other Ambulatory Visit: Payer: Self-pay | Admitting: Internal Medicine

## 2012-11-16 LAB — CBC WITH DIFFERENTIAL/PLATELET
Basophil #: 0 10*3/uL (ref 0.0–0.1)
Basophil %: 0.4 %
Eosinophil #: 0.1 10*3/uL (ref 0.0–0.7)
HCT: 39.9 % — ABNORMAL LOW (ref 40.0–52.0)
Lymphocyte #: 1.3 10*3/uL (ref 1.0–3.6)
Lymphocyte %: 29.4 %
MCH: 30.3 pg (ref 26.0–34.0)
MCHC: 35 g/dL (ref 32.0–36.0)
MCV: 87 fL (ref 80–100)
Monocyte #: 0.5 x10 3/mm (ref 0.2–1.0)
Monocyte %: 10.8 %
Neutrophil %: 56.4 %
Platelet: 174 10*3/uL (ref 150–440)
WBC: 4.3 10*3/uL (ref 3.8–10.6)

## 2012-11-16 LAB — TSH: Thyroid Stimulating Horm: 2.39 u[IU]/mL

## 2012-11-16 LAB — LIPID PANEL
HDL Cholesterol: 56 mg/dL (ref 40–60)
Ldl Cholesterol, Calc: 86 mg/dL (ref 0–100)
VLDL Cholesterol, Calc: 13 mg/dL (ref 5–40)

## 2012-11-16 LAB — URINALYSIS, COMPLETE
Bacteria: NONE SEEN
Bilirubin,UR: NEGATIVE
Blood: NEGATIVE
Glucose,UR: NEGATIVE mg/dL (ref 0–75)
Leukocyte Esterase: NEGATIVE
RBC,UR: <1 /HPF (ref 0–5)
Specific Gravity: 1.017 (ref 1.003–1.030)
WBC UR: 1 /HPF (ref 0–5)

## 2012-11-16 LAB — COMPREHENSIVE METABOLIC PANEL
Alkaline Phosphatase: 88 U/L (ref 50–136)
BUN: 20 mg/dL — ABNORMAL HIGH (ref 7–18)
Bilirubin,Total: 0.4 mg/dL (ref 0.2–1.0)
Calcium, Total: 8.7 mg/dL (ref 8.5–10.1)
Co2: 35 mmol/L — ABNORMAL HIGH (ref 21–32)
Creatinine: 1.51 mg/dL — ABNORMAL HIGH (ref 0.60–1.30)
EGFR (Non-African Amer.): 48 — ABNORMAL LOW
Glucose: 84 mg/dL (ref 65–99)
Osmolality: 292 (ref 275–301)
Sodium: 146 mmol/L — ABNORMAL HIGH (ref 136–145)
Total Protein: 7.2 g/dL (ref 6.4–8.2)

## 2012-11-17 LAB — PSA: PSA: 1.2 ng/mL (ref 0.0–4.0)

## 2013-05-04 ENCOUNTER — Ambulatory Visit: Payer: Self-pay | Admitting: Neurosurgery

## 2013-06-15 ENCOUNTER — Emergency Department: Payer: Self-pay | Admitting: Emergency Medicine

## 2013-06-15 LAB — CBC
HCT: 44.9 % (ref 40.0–52.0)
HGB: 15.4 g/dL (ref 13.0–18.0)
MCH: 30.7 pg (ref 26.0–34.0)
MCHC: 34.4 g/dL (ref 32.0–36.0)
MCV: 89 fL (ref 80–100)
PLATELETS: 152 10*3/uL (ref 150–440)
RBC: 5.03 10*6/uL (ref 4.40–5.90)
RDW: 13.1 % (ref 11.5–14.5)
WBC: 13.4 10*3/uL — AB (ref 3.8–10.6)

## 2013-06-15 LAB — COMPREHENSIVE METABOLIC PANEL
AST: 16 U/L (ref 15–37)
Albumin: 3.5 g/dL (ref 3.4–5.0)
Alkaline Phosphatase: 97 U/L
Anion Gap: 5 — ABNORMAL LOW (ref 7–16)
BUN: 34 mg/dL — AB (ref 7–18)
Bilirubin,Total: 0.7 mg/dL (ref 0.2–1.0)
CALCIUM: 8.6 mg/dL (ref 8.5–10.1)
Chloride: 102 mmol/L (ref 98–107)
Co2: 34 mmol/L — ABNORMAL HIGH (ref 21–32)
Creatinine: 1.79 mg/dL — ABNORMAL HIGH (ref 0.60–1.30)
EGFR (Non-African Amer.): 39 — ABNORMAL LOW
GFR CALC AF AMER: 46 — AB
Glucose: 183 mg/dL — ABNORMAL HIGH (ref 65–99)
OSMOLALITY: 294 (ref 275–301)
Potassium: 3.1 mmol/L — ABNORMAL LOW (ref 3.5–5.1)
SGPT (ALT): 14 U/L (ref 12–78)
Sodium: 141 mmol/L (ref 136–145)
Total Protein: 7.6 g/dL (ref 6.4–8.2)

## 2013-06-15 LAB — CK TOTAL AND CKMB (NOT AT ARMC)
CK, Total: 98 U/L (ref 35–232)
CK-MB: 1 ng/mL (ref 0.5–3.6)

## 2013-06-15 LAB — TROPONIN I: Troponin-I: <0.02 ng/mL

## 2013-07-13 ENCOUNTER — Other Ambulatory Visit: Payer: Self-pay | Admitting: Internal Medicine

## 2013-07-13 LAB — URINALYSIS, COMPLETE
BACTERIA: NONE SEEN
BLOOD: NEGATIVE
Glucose,UR: NEGATIVE mg/dL (ref 0–75)
Hyaline Cast: 48
Leukocyte Esterase: NEGATIVE
NITRITE: NEGATIVE
Ph: 6 (ref 4.5–8.0)
Protein: 30
RBC,UR: <1 /HPF (ref 0–5)
Specific Gravity: 1.019 (ref 1.003–1.030)

## 2013-07-13 LAB — CBC WITH DIFFERENTIAL/PLATELET
BASOS ABS: 0 10*3/uL (ref 0.0–0.1)
Basophil %: 0.5 %
EOS PCT: 4.4 %
Eosinophil #: 0.2 10*3/uL (ref 0.0–0.7)
HCT: 41.8 % (ref 40.0–52.0)
HGB: 14.1 g/dL (ref 13.0–18.0)
LYMPHS ABS: 1.7 10*3/uL (ref 1.0–3.6)
Lymphocyte %: 37.7 %
MCH: 30.4 pg (ref 26.0–34.0)
MCHC: 33.6 g/dL (ref 32.0–36.0)
MCV: 90 fL (ref 80–100)
MONO ABS: 0.6 x10 3/mm (ref 0.2–1.0)
Monocyte %: 13.1 %
Neutrophil #: 2 10*3/uL (ref 1.4–6.5)
Neutrophil %: 44.3 %
Platelet: 173 10*3/uL (ref 150–440)
RBC: 4.63 10*6/uL (ref 4.40–5.90)
RDW: 13.5 % (ref 11.5–14.5)
WBC: 4.4 10*3/uL (ref 3.8–10.6)

## 2013-07-13 LAB — COMPREHENSIVE METABOLIC PANEL
ANION GAP: 3 — AB (ref 7–16)
Albumin: 3.3 g/dL — ABNORMAL LOW (ref 3.4–5.0)
Alkaline Phosphatase: 101 U/L
BUN: 25 mg/dL — AB (ref 7–18)
Bilirubin,Total: 0.6 mg/dL (ref 0.2–1.0)
CALCIUM: 9.2 mg/dL (ref 8.5–10.1)
CHLORIDE: 102 mmol/L (ref 98–107)
CREATININE: 1.57 mg/dL — AB (ref 0.60–1.30)
Co2: 37 mmol/L — ABNORMAL HIGH (ref 21–32)
EGFR (African American): 53 — ABNORMAL LOW
GFR CALC NON AF AMER: 46 — AB
Glucose: 82 mg/dL (ref 65–99)
OSMOLALITY: 287 (ref 275–301)
Potassium: 3.2 mmol/L — ABNORMAL LOW (ref 3.5–5.1)
SGOT(AST): 14 U/L — ABNORMAL LOW (ref 15–37)
SGPT (ALT): 8 U/L — ABNORMAL LOW (ref 12–78)
SODIUM: 142 mmol/L (ref 136–145)
Total Protein: 7.2 g/dL (ref 6.4–8.2)

## 2013-09-27 ENCOUNTER — Ambulatory Visit: Payer: Self-pay | Admitting: Pain Medicine

## 2013-10-05 ENCOUNTER — Ambulatory Visit: Payer: Self-pay | Admitting: Pain Medicine

## 2013-10-30 ENCOUNTER — Ambulatory Visit: Payer: Self-pay | Admitting: Pain Medicine

## 2013-10-31 ENCOUNTER — Ambulatory Visit: Payer: Self-pay | Admitting: Pain Medicine

## 2013-11-20 ENCOUNTER — Ambulatory Visit: Payer: Self-pay | Admitting: Pain Medicine

## 2014-01-01 ENCOUNTER — Encounter: Payer: Self-pay | Admitting: Neurology

## 2014-02-14 ENCOUNTER — Ambulatory Visit: Payer: Self-pay | Admitting: Internal Medicine

## 2014-02-14 ENCOUNTER — Ambulatory Visit (INDEPENDENT_AMBULATORY_CARE_PROVIDER_SITE_OTHER): Payer: 59 | Admitting: Neurology

## 2014-02-14 ENCOUNTER — Telehealth: Payer: Self-pay | Admitting: Neurology

## 2014-02-14 ENCOUNTER — Other Ambulatory Visit: Payer: Self-pay | Admitting: Neurosurgery

## 2014-02-14 ENCOUNTER — Encounter: Payer: Self-pay | Admitting: Neurology

## 2014-02-14 VITALS — BP 110/64 | HR 80 | Ht 78.0 in | Wt 187.0 lb

## 2014-02-14 DIAGNOSIS — R482 Apraxia: Secondary | ICD-10-CM

## 2014-02-14 DIAGNOSIS — K117 Disturbances of salivary secretion: Secondary | ICD-10-CM

## 2014-02-14 DIAGNOSIS — R488 Other symbolic dysfunctions: Secondary | ICD-10-CM

## 2014-02-14 DIAGNOSIS — G249 Dystonia, unspecified: Secondary | ICD-10-CM

## 2014-02-14 DIAGNOSIS — G20 Parkinson's disease: Secondary | ICD-10-CM | POA: Insufficient documentation

## 2014-02-14 DIAGNOSIS — G20A1 Parkinson's disease without dyskinesia, without mention of fluctuations: Secondary | ICD-10-CM | POA: Insufficient documentation

## 2014-02-14 DIAGNOSIS — R279 Unspecified lack of coordination: Secondary | ICD-10-CM

## 2014-02-14 DIAGNOSIS — F0280 Dementia in other diseases classified elsewhere without behavioral disturbance: Secondary | ICD-10-CM

## 2014-02-14 LAB — CBC WITH DIFFERENTIAL/PLATELET
Basophil #: 0 10*3/uL (ref 0.0–0.1)
Basophil %: 0.5 %
EOS PCT: 4 %
Eosinophil #: 0.2 10*3/uL (ref 0.0–0.7)
HCT: 42 % (ref 40.0–52.0)
HGB: 14 g/dL (ref 13.0–18.0)
LYMPHS ABS: 1.6 10*3/uL (ref 1.0–3.6)
Lymphocyte %: 32.7 %
MCH: 30.9 pg (ref 26.0–34.0)
MCHC: 33.4 g/dL (ref 32.0–36.0)
MCV: 93 fL (ref 80–100)
Monocyte #: 0.6 x10 3/mm (ref 0.2–1.0)
Monocyte %: 12 %
NEUTROS PCT: 50.8 %
Neutrophil #: 2.5 10*3/uL (ref 1.4–6.5)
Platelet: 162 10*3/uL (ref 150–440)
RBC: 4.53 10*6/uL (ref 4.40–5.90)
RDW: 13.5 % (ref 11.5–14.5)
WBC: 4.9 10*3/uL (ref 3.8–10.6)

## 2014-02-14 LAB — COMPREHENSIVE METABOLIC PANEL
ALBUMIN: 3.4 g/dL (ref 3.4–5.0)
AST: 19 U/L (ref 15–37)
Alkaline Phosphatase: 87 U/L
Anion Gap: 8 (ref 7–16)
BUN: 37 mg/dL — ABNORMAL HIGH (ref 7–18)
Bilirubin,Total: 0.8 mg/dL (ref 0.2–1.0)
CO2: 32 mmol/L (ref 21–32)
Calcium, Total: 8.7 mg/dL (ref 8.5–10.1)
Chloride: 106 mmol/L (ref 98–107)
Creatinine: 2.05 mg/dL — ABNORMAL HIGH (ref 0.60–1.30)
EGFR (African American): 39 — ABNORMAL LOW
EGFR (Non-African Amer.): 33 — ABNORMAL LOW
Glucose: 83 mg/dL (ref 65–99)
Osmolality: 298 (ref 275–301)
POTASSIUM: 3.7 mmol/L (ref 3.5–5.1)
SGPT (ALT): <6 U/L — ABNORMAL LOW
SODIUM: 146 mmol/L — AB (ref 136–145)
TOTAL PROTEIN: 7.2 g/dL (ref 6.4–8.2)

## 2014-02-14 LAB — URINALYSIS, COMPLETE
Blood: NEGATIVE
Glucose,UR: NEGATIVE mg/dL (ref 0–75)
Hyaline Cast: 32
LEUKOCYTE ESTERASE: NEGATIVE
Nitrite: NEGATIVE
Ph: 5 (ref 4.5–8.0)
RBC,UR: NONE SEEN /HPF (ref 0–5)
Specific Gravity: 1.024 (ref 1.003–1.030)

## 2014-02-14 LAB — LIPID PANEL
Cholesterol: 151 mg/dL (ref 0–200)
HDL Cholesterol: 61 mg/dL — ABNORMAL HIGH (ref 40–60)
Ldl Cholesterol, Calc: 81 mg/dL (ref 0–100)
Triglycerides: 43 mg/dL (ref 0–200)
VLDL CHOLESTEROL, CALC: 9 mg/dL (ref 5–40)

## 2014-02-14 NOTE — Procedures (Signed)
DBS Programming was performed.    Total time spent programming was 40 minutes.  Device was confirmed to be on.  Soft start was confirmed to be on.  Impedences were checked and were within normal limits.  Battery was checked and was determined to be near the end of life.  Final settings were as follows:  Left brain electrode:     1-C+           ; Amplitude  2.2   V   ; Pulse width 90 microseconds;   Frequency   160   Hz.  Right brain electrode:     6-7-C+          ; Amplitude   1.3  V ;  Pulse width 150  microseconds;  Frequency   160    Hz.

## 2014-02-14 NOTE — Telephone Encounter (Signed)
Spoke with Janett Billow with Dr Vertell Limber and battery change scheduled for 03/08/2014. They are still working on pre-op appt and they will contact patient with appt date/times.

## 2014-02-14 NOTE — Progress Notes (Addendum)
Hunter Brown was seen today in the movement disorders clinic for neurologic consultation at the request of [facility], [facility], MD.  The consultation is for the evaluation of PD.  The patient is seen today in the movement disorder clinic at the request of Dr. Manuella Ghazi.  The patient has previously seen Dr. Linus Mako as well.  I reviewed notes from both of them.  Patient was diagnosed with Parkinson's disease in 18 at the age of 63 years old.  Patient's first symptom was mild tremor in the R hand.  Pt states that his biggest problem was rigidity and that was what made him pursue DBS.  The patient underwent bilateral STN DBS on 04/13/2003.  This was done without microelectrode recording.  His generator was last replaced in 2009.    The patient is currently on carbidopa/levodopa 25/100, 6 tablets per day.  He is also on Azilect and amantadine.  Patient is on Aricept for memory changes and Florinef 1 mg twice a day for orthostatic hypotension.   Specific Symptoms:  Tremor: Yes.   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    Acting out dreams:  Yes.   Wet Pillows: Yes.   Postural symptoms:  Yes.    Falls?  Yes.   (tried to literally jump over guard rail at theatre and fell about 6 weeks ago) Bradykinesia symptoms: difficulty with initiating movement and difficulty getting out of a chair Loss of smell:  No. Loss of taste:  No. Urinary Incontinence:  Yes.   (rarely) Difficulty Swallowing:  rarely Handwriting, micrographia: Yes.   Trouble with ADL's:  Yes.    Trouble buttoning clothing: Yes.   Depression:  No. Memory changes:  Yes.   Hallucinations:  Yes.    visual distortions: Yes.   N/V:  No. Lightheaded:  Yes.    Syncope: No. Diplopia:  Yes.   Dyskinesia:  Yes.     ALLERGIES:  No Known Allergies  CURRENT MEDICATIONS:  Outpatient Encounter Prescriptions as  of 02/14/2014  Medication Sig  . ALPRAZolam (XANAX) 0.5 MG tablet Take 0.5 mg by mouth 3 (three) times daily.  Marland Kitchen amantadine (SYMMETREL) 100 MG capsule Take 100 mg by mouth 3 (three) times daily.  . baclofen (LIORESAL) 10 MG tablet Take 10 mg by mouth 3 (three) times daily.   . carbidopa-levodopa (SINEMET CR) 50-200 MG per tablet Take 1 tablet by mouth 6 (six) times daily.  Marland Kitchen docusate sodium (COLACE) 100 MG capsule Take 100 mg by mouth 2 (two) times daily.  Marland Kitchen donepezil (ARICEPT) 5 MG tablet Take 5 mg by mouth daily.  . fludrocortisone (FLORINEF) 0.1 MG tablet Take 0.1 mg by mouth 2 (two) times daily.  . potassium chloride SA (K-DUR,KLOR-CON) 20 MEQ tablet Take 20 mEq by mouth daily.  . rasagiline (AZILECT) 1 MG TABS tablet Take 1 mg by mouth daily.  . sildenafil (VIAGRA) 100 MG tablet Take 100 mg by mouth daily as needed for erectile dysfunction.  . [DISCONTINUED] etodolac (LODINE) 400 MG tablet Take 400 mg by mouth 2 (two) times daily.    PAST MEDICAL HISTORY:   Past Medical History  Diagnosis Date  . Parkinson's disease   . Hypertension   . Peripheral neuropathy   . Anxiety   . Scoliosis   . Osteoarthritis   . Nephrolithiasis  PAST SURGICAL HISTORY:   Past Surgical History  Procedure Laterality Date  . Tonsillectomy    . Hemorroidectomy    . Burr hole w/ stereotactic insertion of dbs leads / intraop microelectrode recording    . Subthalamic stimulator battery replacement      SOCIAL HISTORY:   History   Social History  . Marital Status: Married    Spouse Name: N/A    Number of Children: N/A  . Years of Education: N/A   Occupational History  . Not on file.   Social History Main Topics  . Smoking status: Former Smoker    Quit date: 02/14/1974  . Smokeless tobacco: Not on file  . Alcohol Use: No  . Drug Use: No  . Sexual Activity: Not on file   Other Topics Concern  . Not on file   Social History Narrative  . No narrative on file    FAMILY HISTORY:     Family Status  Relation Status Death Age  . Mother Deceased     brain tumor  . Father Deceased     heart attack, dementia  . Brother Deceased     cancer  . Sister Deceased     diabetes  . Brother Alive     prostate cancer  . Son Alive     healthy  . Son Alive     healthy    ROS:  A complete 10 system review of systems was obtained and was unremarkable apart from what is mentioned above.  PHYSICAL EXAMINATION:    VITALS:   Filed Vitals:   02/14/14 1017  BP: 110/64  Pulse: 80  Height: 6\' 6"  (1.981 m)  Weight: 187 lb (84.823 kg)    GEN:  The patient appears stated age and is in NAD. HEENT:  Normocephalic, atraumatic.  The mucous membranes are moist. The superficial temporal arteries are without ropiness or tenderness.  Has skin lesion near cap of the R scalp electrode with scap and surrounding erythematous rash. CV:  RRR Lungs:  CTAB Neck/HEME:  There are no carotid bruits bilaterally.  Neurological examination:  Orientation:  Montreal Cognitive Assessment  02/14/2014  Visuospatial/ Executive (0/5) 2  Naming (0/3) 2  Attention: Read list of digits (0/2) 2  Attention: Read list of letters (0/1) 1  Attention: Serial 7 subtraction starting at 100 (0/3) 3  Language: Repeat phrase (0/2) 2  Language : Fluency (0/1) 1  Abstraction (0/2) 2  Delayed Recall (0/5) 0  Orientation (0/6) 5  Total 20  Adjusted Score (based on education) 20    Cranial nerves: There is good facial symmetry. Pupils are equal round and reactive to light bilaterally. Fundoscopic exam reveals clear margins bilaterally. Extraocular muscles are intact. Has eyelid opening apraxia.  The visual fields are full to confrontational testing. The speech is fluent and clear.  Has some word finding trouble.   Soft palate rises symmetrically and there is no tongue deviation. Hearing is intact to conversational tone. Sensation: Sensation is intact to light and pinprick throughout (facial, trunk, extremities).  Vibration is intact at the bilateral big toe. There is no extinction with double simultaneous stimulation. There is no sensory dermatomal level identified. Motor: Strength is 5/5 in the bilateral upper and lower extremities.   Shoulder shrug is equal and symmetric.  There is no pronator drift. Deep tendon reflexes: Deep tendon reflexes are 1/4 at the bilateral biceps, triceps, brachioradialis, patella and achilles. Plantar responses are downgoing bilaterally.  Movement examination: Tone: There is normal  tone in the bilateral upper extremities.  The tone in the lower extremities is normal.  Abnormal movements: mild dyskinesia noted Coordination:  There is no decremation with RAM's, either in the UE/LE Gait and Station: The patient has no difficulty arising out of a deep-seated chair without the use of the hands. The patient's stride length is normal with exaggerated arm swing due to dyskinesia.  The patient has a negative pull test.      DS programming was performed today, which is described in more detail on a separate programming procedure note.  In 3, there were no significant changes following programming, as the patient's generator is nearing end-of-life.  ASSESSMENT/PLAN:  1.  Idiopathic Parkinson's disease, diagnosed in 14.  -The patient is status post bilateral STN DBS in November, 2004, done without MER recording.  -The patients generator was last replaced in 2009.  The patient's battery is definitely at end of life.  I will refer him to Dr. Vertell Limber for replacement.  -I want to explore his contacts much further, but knew that if I push them up too much today, his battery would die before it was able to be changed out.  I would like to see [place] relying much more on DBS therapy and much less on medication, especially now that he is having hallucinations.  In addition, he is having orthostasis.  I think that we can reduce his levodopa load, and perhaps even reduce the amantadine, which hopefully  would help both for hallucinations as well as the orthostasis. 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  I did talk about the value of Botox, especially if driving is a consideration.  I really am not sure that he should be driving if he does not have the Botox.  Even if he does, then he may need an occupational therapy driving evaluation. 3.  Sialorrhea.  -We discussed the value of Myobloc.  He thinks he may be interested.  Again, we can talk about that further, after his generator is replaced. 4.  Parkinson's related memory changes.  -He is on Aricept. 5.  Scalp lesion near R DBS electrode  -reports been present for years but getting worse.  Told them that this could cause skin breakdown and infect lead.  Needs to call derm (has one) and get this checked out 6.  he will continue to follow with Dr Manuella Ghazi for the medical management of his Parkinson's disease.  I do plan to see him back for a followup visit for a more detailed evaluation of his DBS after Dr. Vertell Limber replaces the generator.  Greater than 50% of the 60 minute visit spent in counseling and coordinating care today.

## 2014-02-14 NOTE — Patient Instructions (Signed)
1. We will call you with an appt with Dr Vertell Limber for battery replacement. Follow up with [place] after surgery.

## 2014-02-14 NOTE — Progress Notes (Signed)
Note faxed to Dr Manuella Ghazi at [phone] with confirmation received.

## 2014-02-15 LAB — PSA: PSA: 1.6 ng/mL (ref 0.0–4.0)

## 2014-02-15 NOTE — Telephone Encounter (Signed)
Spoke with Hunter Brown and they are trying to move patient's surgery to the last week in September. She is awaiting approval and will let us know.

## 2014-02-15 NOTE — Telephone Encounter (Signed)
Ok, but since it is so far out, tell patient that I should probably see him in a week to 10 days (maybe around the 30th) just for a quick appt to check that battery again.  It won't take me long at all (maybe 5-10 min)

## 2014-02-19 NOTE — Telephone Encounter (Signed)
[name] with Dr Melven Sartorius office called to make [place] aware that patient's battery change has been moved to 02/26/2014.

## 2014-02-22 ENCOUNTER — Encounter (HOSPITAL_COMMUNITY): Payer: Self-pay | Admitting: Pharmacy Technician

## 2014-02-22 ENCOUNTER — Encounter (HOSPITAL_COMMUNITY): Payer: Self-pay

## 2014-02-22 ENCOUNTER — Encounter (HOSPITAL_COMMUNITY)
Admission: RE | Admit: 2014-02-22 | Discharge: 2014-02-22 | Disposition: A | Discharge status: Home / Self Care | Payer: 59 | Source: Ambulatory Visit | Attending: Neurosurgery | Admitting: Neurosurgery

## 2014-02-22 DIAGNOSIS — G20 Parkinson's disease: Secondary | ICD-10-CM | POA: Diagnosis not present

## 2014-02-22 DIAGNOSIS — Z01812 Encounter for preprocedural laboratory examination: Secondary | ICD-10-CM | POA: Diagnosis present

## 2014-02-22 DIAGNOSIS — Z01810 Encounter for preprocedural cardiovascular examination: Secondary | ICD-10-CM | POA: Insufficient documentation

## 2014-02-22 DIAGNOSIS — I10 Essential (primary) hypertension: Secondary | ICD-10-CM | POA: Diagnosis not present

## 2014-02-22 DIAGNOSIS — T85695A Other mechanical complication of other nervous system device, implant or graft, initial encounter: Secondary | ICD-10-CM | POA: Insufficient documentation

## 2014-02-22 DIAGNOSIS — Z9889 Other specified postprocedural states: Secondary | ICD-10-CM | POA: Insufficient documentation

## 2014-02-22 DIAGNOSIS — G20A1 Parkinson's disease without dyskinesia, without mention of fluctuations: Secondary | ICD-10-CM | POA: Insufficient documentation

## 2014-02-22 HISTORY — DX: Other specified abnormal findings of blood chemistry: R79.89

## 2014-02-22 HISTORY — DX: Other chest pain: R07.89

## 2014-02-22 HISTORY — DX: Shortness of breath: R06.02

## 2014-02-22 HISTORY — DX: Unspecified malignant neoplasm of skin of unspecified part of face: C44.300

## 2014-02-22 LAB — BASIC METABOLIC PANEL
Anion gap: 11 (ref 5–15)
BUN: 32 mg/dL — ABNORMAL HIGH (ref 6–23)
CO2: 31 mEq/L (ref 19–32)
CREATININE: 1.5 mg/dL — AB (ref 0.50–1.35)
Calcium: 8.9 mg/dL (ref 8.4–10.5)
Chloride: 106 mEq/L (ref 96–112)
GFR calc Af Amer: 55 mL/min — ABNORMAL LOW (ref >90)
GFR calc non Af Amer: 47 mL/min — ABNORMAL LOW (ref >90)
Glucose, Bld: 110 mg/dL — ABNORMAL HIGH (ref 70–99)
POTASSIUM: 4.2 meq/L (ref 3.7–5.3)
Sodium: 148 mEq/L — ABNORMAL HIGH (ref 137–147)

## 2014-02-22 NOTE — Progress Notes (Signed)
02/22/14 1411  OBSTRUCTIVE SLEEP APNEA  Have you ever been diagnosed with sleep apnea through a sleep study? No  Do you snore loudly (loud enough to be heard through closed doors)?  0  Do you often feel tired, fatigued, or sleepy during the daytime? 1  Has anyone observed you stop breathing during your sleep? 0  Do you have, or are you being treated for high blood pressure? 0  BMI more than 35 kg/m2? 0  Age over 64 years old? 1  Neck circumference greater than 40 cm/16 inches? 1  Gender: 1  Obstructive Sleep Apnea Score 4  Score 4 or greater  Results sent to PCP   This patient has screened at risk for sleep apnea using the STOP bang tool used during a pre-surgical visit. A score of 4 or greater is at risk for sleep apnea.

## 2014-02-22 NOTE — Progress Notes (Signed)
Patient informed Nurse that he had a stress test several years ago (> 5) but denied having a cardiac cath, or sleep study. Wife was at chair side during PAT visit and informed Nurse that patient had labs and a chest xray performed on 02/14/14 at [place], and that patients creatinine level was elevated to be greater than 2 and that patient was scheduled to see a nephrologist. Will request records and will repeat BMET today.

## 2014-02-22 NOTE — Pre-Procedure Instructions (Signed)
Hunter Brown  02/22/2014   Your procedure is scheduled on:  Monday February 26, 2014 at 3:09 PM.  Report to [facility] Admitting at 12:00 PM.  Call this number if you have problems the morning of surgery: [phone]   Remember:   Do not eat food or drink liquids after midnight.   Take these medicines the morning of surgery with A SIP OF WATER: Alprazolam (Xanax), Amantadine (Symmetrel), Baclofen (Lioresal), Carbidopa (Sinemet), Fludrocortisone (Florinef), and Rasagiline (Azilect)   Do not wear jewelry.  Do not wear lotions, powders, or cologne.   Men may shave face and neck.  Do not bring valuables to the hospital.  [facility] is not responsible for any belongings or valuables.               Contacts, dentures or bridgework may not be worn into surgery.  Leave suitcase in the car. After surgery it may be brought to your room.  For patients admitted to the hospital, discharge time is determined by your treatment team.               Patients discharged the day of surgery will not be allowed to drive home.  Name and phone number of your driver: Family/Friend  Special Instructions: Shower using CHG soap the night before and the morning of your surgery   Please read over the following fact sheets that you were given: Pain Booklet, Coughing and Deep Breathing and Surgical Site Infection Prevention

## 2014-02-23 NOTE — Progress Notes (Addendum)
Anesthesia Chart Review:  Patient is a 63 year old male scheduled for deep brain stimulator battery replacement on 02/26/14 by Dr. Vertell Limber. Battery is at end of life.   History includes former smoker, Parkinson's disease, peripheral neuropathy, scoliosis, HTN, skin cancer, CKD, nephrolithiasis, osteoarthritis, back surgery. Chest heaviness was listed on his PAT assessment, but it does not appear that an anesthesia representative was notified of this at the time of his PAT visit. OSA screening score is 4. His wife Vaughan Basta is a long time employee at [employer], with plans to retire next week. Neurologist is Dr. Wells Guiles Tat.  PCP is reported as Dr. Fulton Reek with [facility].   I called phone number for patient listed in Epic which was actually his wife Linda's phone number.  She gave me patient's home [phone]) and cell number ([phone]). When asked about his chest heaviness, he reports it's actually more exertional dyspnea that started approximately one year ago (ie when walking about 50 yards).  He has attributed it to deconditioning due to a 25 lb weight loss following a series of surgeries over the last few years.  He also reported intermittent episodes of passing out for a few seconds when he would get out of the shower.  He says this has been going on for a few years.  He denied edema or significant palpitations. He has never been told that he has a heart murmur.  His bedroom is on the second story, and he doesn't have any significant symptoms with that but has to walk carefully and hold on tight to railings due to fall risk. He believes he had a stress test a few years ago at [place].  He reports that Dr. Doy Hutching has referred him to a nephrologist Dr. Juleen China and cardiologist Dr. Nehemiah Massed. His wife said both appointments are scheduled for 03/16/14. He does have a family history of CAD in his father (first MI at age 66, deceased at age 75; brother with CAD but has not required PCI).     EKG on  02/22/14 showed: NSR, incomplete right BBB.  CXR on 02/14/14 showed: Underlying emphysematous change. Heart size WNL. No apparent edema or consolidation.  Preoperative BMET noted.  Na 148, BUN/Cr 32/1.50 (done from 37/2.05 on 02/14/14), glucose 110. CBC on 01/1614 from [facility] was WNL.  I've reviewed with anesthesiologist Dr. Linna Caprice. He is contacting Dr. Vertell Limber to discuss patient's upcoming appointments and to inquire about urgency of procedure. Janett Billow at Dr. Melven Sartorius office reported that case was initially scheduled further out, but Dr. Carles Collet had called and said it needed to be done sooner.)  If general anesthesia is planned and case is not felt urgent, then anesthesiologist would recommend postponing case until he has been evaluated by cardiology. If Dr. Linna Caprice does not hear back from Dr. Vertell Limber before the end of the day then a discussion will need to take place on the day of surgery. Case is currently posted for late Monday afternoon (after 3PM).    [facility] Stay [facility]/Anesthesiology Phone [phone] 02/23/2014 2:20 PM

## 2014-02-24 NOTE — Discharge Instructions (Signed)
Wound Care Leave incision open to air. You may shower. Do not scrub directly on incision.  Do not put any creams, lotions, or ointments on incision. Activity Walk each and every day, increasing distance each day. No lifting greater than 5 lbs.  Avoid bending, arching, and twisting. No driving for 2 weeks; may ride as a passenger locally.  Diet Resume your normal diet.  Return to Work Will be discussed at you follow up appointment. Call Your Doctor If Any of These Occur Redness, drainage, or swelling at the wound.  Temperature greater than 101 degrees. Severe pain not relieved by pain medication. Incision starts to come apart. Follow Up Appt Call today for appointment in 3-4 weeks [phone]) or for problems.  If you have any hardware placed in your spine, you will need an x-ray before your appointment.  

## 2014-02-25 MED ORDER — CEFAZOLIN SODIUM-DEXTROSE 2-3 GM-% IV SOLR
2.0000 g | INTRAVENOUS | Status: AC
  Administered 2014-02-26: 2 g via INTRAVENOUS

## 2014-02-26 ENCOUNTER — Ambulatory Visit (HOSPITAL_COMMUNITY): Payer: 59 | Admitting: Vascular Surgery

## 2014-02-26 ENCOUNTER — Encounter (HOSPITAL_COMMUNITY)
Admission: RE | Disposition: A | Discharge status: Home / Self Care | Payer: Self-pay | Source: Ambulatory Visit | Attending: Neurosurgery

## 2014-02-26 ENCOUNTER — Ambulatory Visit (HOSPITAL_COMMUNITY)
Admission: RE | Admit: 2014-02-26 | Discharge: 2014-02-26 | Disposition: A | Discharge status: Home / Self Care | Payer: 59 | Source: Ambulatory Visit | Attending: Neurosurgery | Admitting: Neurosurgery

## 2014-02-26 ENCOUNTER — Encounter (HOSPITAL_COMMUNITY): Payer: 59 | Admitting: Vascular Surgery

## 2014-02-26 ENCOUNTER — Encounter (HOSPITAL_COMMUNITY): Payer: Self-pay | Admitting: Certified Registered Nurse Anesthetist

## 2014-02-26 DIAGNOSIS — Y838 Other surgical procedures as the cause of abnormal reaction of the patient, or of later complication, without mention of misadventure at the time of the procedure: Secondary | ICD-10-CM | POA: Diagnosis not present

## 2014-02-26 DIAGNOSIS — I10 Essential (primary) hypertension: Secondary | ICD-10-CM | POA: Diagnosis not present

## 2014-02-26 DIAGNOSIS — G609 Hereditary and idiopathic neuropathy, unspecified: Secondary | ICD-10-CM | POA: Diagnosis not present

## 2014-02-26 DIAGNOSIS — M4120 Other idiopathic scoliosis, site unspecified: Secondary | ICD-10-CM | POA: Diagnosis not present

## 2014-02-26 DIAGNOSIS — G20 Parkinson's disease: Secondary | ICD-10-CM | POA: Diagnosis not present

## 2014-02-26 DIAGNOSIS — T85695A Other mechanical complication of other nervous system device, implant or graft, initial encounter: Secondary | ICD-10-CM | POA: Diagnosis present

## 2014-02-26 DIAGNOSIS — G20A1 Parkinson's disease without dyskinesia, without mention of fluctuations: Secondary | ICD-10-CM | POA: Insufficient documentation

## 2014-02-26 HISTORY — PX: SUBTHALAMIC STIMULATOR BATTERY REPLACEMENT: SHX5405

## 2014-02-26 SURGERY — SUBTHALAMIC STIMULATOR BATTERY REPLACEMENT
Anesthesia: General

## 2014-02-26 MED ORDER — LIDOCAINE HCL (CARDIAC) 20 MG/ML IV SOLN
INTRAVENOUS | Status: AC
  Filled 2014-02-26: qty 5

## 2014-02-26 MED ORDER — FENTANYL CITRATE 0.05 MG/ML IJ SOLN
INTRAMUSCULAR | Status: AC
  Filled 2014-02-26: qty 5

## 2014-02-26 MED ORDER — MIDAZOLAM HCL 5 MG/5ML IJ SOLN
INTRAMUSCULAR | Status: DC | PRN
  Administered 2014-02-26: 2 mg via INTRAVENOUS

## 2014-02-26 MED ORDER — PROPOFOL 10 MG/ML IV BOLUS
INTRAVENOUS | Status: DC | PRN
  Administered 2014-02-26: 130 mg via INTRAVENOUS

## 2014-02-26 MED ORDER — SODIUM CHLORIDE 0.9 % IV SOLN
INTRAVENOUS | Status: DC
  Administered 2014-02-26: 10 mL/h via INTRAVENOUS

## 2014-02-26 MED ORDER — LIDOCAINE HCL (CARDIAC) 20 MG/ML IV SOLN
INTRAVENOUS | Status: DC | PRN
  Administered 2014-02-26: 40 mg via INTRAVENOUS
  Administered 2014-02-26: 60 mg via INTRAVENOUS

## 2014-02-26 MED ORDER — LIDOCAINE-EPINEPHRINE 1 %-1:100000 IJ SOLN
INTRAMUSCULAR | Status: DC | PRN
  Administered 2014-02-26: 14 mL

## 2014-02-26 MED ORDER — ONDANSETRON HCL 4 MG/2ML IJ SOLN
INTRAMUSCULAR | Status: AC
  Filled 2014-02-26: qty 2

## 2014-02-26 MED ORDER — ROCURONIUM BROMIDE 50 MG/5ML IV SOLN
INTRAVENOUS | Status: AC
Start: 2014-02-26 — End: 2014-02-26
  Filled 2014-02-26: qty 1

## 2014-02-26 MED ORDER — ONDANSETRON HCL 4 MG/2ML IJ SOLN
INTRAMUSCULAR | Status: DC | PRN
  Administered 2014-02-26: 4 mg via INTRAVENOUS

## 2014-02-26 MED ORDER — VANCOMYCIN HCL 1000 MG IV SOLR
INTRAVENOUS | Status: DC | PRN
Start: 2014-02-26 — End: 2014-02-26
  Administered 2014-02-26: 1000 mg

## 2014-02-26 MED ORDER — ONDANSETRON HCL 4 MG/2ML IJ SOLN: 4.0000 mg | Freq: Once | INTRAMUSCULAR | Status: DC | PRN

## 2014-02-26 MED ORDER — ARTIFICIAL TEARS OP OINT
TOPICAL_OINTMENT | OPHTHALMIC | Status: AC
  Filled 2014-02-26: qty 3.5

## 2014-02-26 MED ORDER — PROPOFOL 10 MG/ML IV BOLUS
INTRAVENOUS | Status: AC
  Filled 2014-02-26: qty 20

## 2014-02-26 MED ORDER — VANCOMYCIN HCL 1000 MG IV SOLR
INTRAVENOUS | Status: AC
  Filled 2014-02-26: qty 1000

## 2014-02-26 MED ORDER — EPHEDRINE SULFATE 50 MG/ML IJ SOLN
INTRAMUSCULAR | Status: DC | PRN
  Administered 2014-02-26 (×3): 10 mg via INTRAVENOUS
  Administered 2014-02-26: 5 mg via INTRAVENOUS

## 2014-02-26 MED ORDER — MIDAZOLAM HCL 2 MG/2ML IJ SOLN
INTRAMUSCULAR | Status: AC
  Filled 2014-02-26: qty 2

## 2014-02-26 MED ORDER — FENTANYL CITRATE 0.05 MG/ML IJ SOLN
INTRAMUSCULAR | Status: DC | PRN
  Administered 2014-02-26: 50 ug via INTRAVENOUS

## 2014-02-26 MED ORDER — 0.9 % SODIUM CHLORIDE (POUR BTL) OPTIME
TOPICAL | Status: DC | PRN
  Administered 2014-02-26: 1000 mL

## 2014-02-26 MED ORDER — CEFAZOLIN SODIUM-DEXTROSE 2-3 GM-% IV SOLR
INTRAVENOUS | Status: AC
  Filled 2014-02-26: qty 50

## 2014-02-26 MED ORDER — BUPIVACAINE HCL (PF) 0.5 % IJ SOLN
INTRAMUSCULAR | Status: DC | PRN
  Administered 2014-02-26: 14 mL

## 2014-02-26 MED ORDER — SODIUM CHLORIDE 0.9 % IV SOLN
INTRAVENOUS | Status: DC | PRN
  Administered 2014-02-26: 14:00:00 via INTRAVENOUS

## 2014-02-26 MED ORDER — HYDROMORPHONE HCL 1 MG/ML IJ SOLN: 0.2500 mg | INTRAMUSCULAR | Status: DC | PRN

## 2014-02-26 MED ORDER — GLYCOPYRROLATE 0.2 MG/ML IJ SOLN
INTRAMUSCULAR | Status: DC | PRN
  Administered 2014-02-26: 0.2 mg via INTRAVENOUS

## 2014-02-26 SURGICAL SUPPLY — 62 items
BAG DECANTER FOR FLEXI CONT (MISCELLANEOUS) ×3 IMPLANT
BLADE SURG 10 STRL SS (BLADE) ×3 IMPLANT
BLADE SURG 15 STRL LF DISP TIS (BLADE) ×1 IMPLANT
BLADE SURG 15 STRL SS (BLADE) ×2
CANISTER SUCT 3000ML (MISCELLANEOUS) ×3 IMPLANT
CLOSURE WOUND 1/2 X4 (GAUZE/BANDAGES/DRESSINGS)
CONT SPEC 4OZ CLIKSEAL STRL BL (MISCELLANEOUS) ×3 IMPLANT
CORDS BIPOLAR (ELECTRODE) ×3 IMPLANT
DERMABOND ADVANCED (GAUZE/BANDAGES/DRESSINGS) ×2
DERMABOND ADVANCED .7 DNX12 (GAUZE/BANDAGES/DRESSINGS) ×1 IMPLANT
DRAPE CAMERA VIDEO/LASER (DRAPES) ×3 IMPLANT
DRAPE INCISE IOBAN 66X45 STRL (DRAPES) ×3 IMPLANT
DRAPE LAPAROTOMY 100X72 PEDS (DRAPES) ×3 IMPLANT
DRAPE POUCH INSTRU U-SHP 10X18 (DRAPES) ×3 IMPLANT
DRSG OPSITE 4X5.5 SM (GAUZE/BANDAGES/DRESSINGS) IMPLANT
DRSG OPSITE POSTOP 4X6 (GAUZE/BANDAGES/DRESSINGS) ×3 IMPLANT
DRSG TELFA 3X8 NADH (GAUZE/BANDAGES/DRESSINGS) IMPLANT
ELECT CAUTERY BLADE 6.4 (BLADE) ×3 IMPLANT
GAUZE SPONGE 4X4 12PLY STRL (GAUZE/BANDAGES/DRESSINGS) IMPLANT
GAUZE SPONGE 4X4 16PLY XRAY LF (GAUZE/BANDAGES/DRESSINGS) ×3 IMPLANT
GLOVE BIO SURGEON STRL SZ8 (GLOVE) ×3 IMPLANT
GLOVE BIOGEL PI IND STRL 7.0 (GLOVE) ×2 IMPLANT
GLOVE BIOGEL PI IND STRL 8 (GLOVE) IMPLANT
GLOVE BIOGEL PI IND STRL 8.5 (GLOVE) ×1 IMPLANT
GLOVE BIOGEL PI INDICATOR 7.0 (GLOVE) ×4
GLOVE BIOGEL PI INDICATOR 8 (GLOVE)
GLOVE BIOGEL PI INDICATOR 8.5 (GLOVE) ×2
GLOVE ECLIPSE 8.0 STRL XLNG CF (GLOVE) IMPLANT
GLOVE EXAM NITRILE LRG STRL (GLOVE) ×3 IMPLANT
GLOVE EXAM NITRILE MD LF STRL (GLOVE) IMPLANT
GLOVE EXAM NITRILE XL STR (GLOVE) IMPLANT
GLOVE EXAM NITRILE XS STR PU (GLOVE) IMPLANT
GLOVE INDICATOR 7.5 STRL GRN (GLOVE) ×3 IMPLANT
GOWN STRL REUS W/ TWL LRG LVL3 (GOWN DISPOSABLE) ×1 IMPLANT
GOWN STRL REUS W/ TWL XL LVL3 (GOWN DISPOSABLE) ×1 IMPLANT
GOWN STRL REUS W/TWL 2XL LVL3 (GOWN DISPOSABLE) IMPLANT
GOWN STRL REUS W/TWL LRG LVL3 (GOWN DISPOSABLE) ×2
GOWN STRL REUS W/TWL XL LVL3 (GOWN DISPOSABLE) ×2
KIT BASIN OR (CUSTOM PROCEDURE TRAY) ×3 IMPLANT
KIT ROOM TURNOVER OR (KITS) ×3 IMPLANT
NEEDLE HYPO 25X1 1.5 SAFETY (NEEDLE) ×3 IMPLANT
NEUROSTIM OCTOPOLAR ~~LOC~~ 49X65 (Neuro Prosthesis/Implant) ×3 IMPLANT
NEUROSTIM PROGRAMMER 2.2X3.7 (Neuro Prosthesis/Implant) ×3 IMPLANT
NS IRRIG 1000ML POUR BTL (IV SOLUTION) ×3 IMPLANT
PACK EENT II TURBAN DRAPE (CUSTOM PROCEDURE TRAY) ×3 IMPLANT
PAD ARMBOARD 7.5X6 YLW CONV (MISCELLANEOUS) ×9 IMPLANT
PATIENT PROGRAMMER ×2 IMPLANT
PENCIL BUTTON HOLSTER BLD 10FT (ELECTRODE) ×3 IMPLANT
STAPLER SKIN PROX WIDE 3.9 (STAPLE) ×3 IMPLANT
STOCKINETTE 4X48 STRL (DRAPES) ×3 IMPLANT
STRIP CLOSURE SKIN 1/2X4 (GAUZE/BANDAGES/DRESSINGS) IMPLANT
SUT SILK 2 0 TIES 10X30 (SUTURE) ×3 IMPLANT
SUT VIC AB 2-0 CP2 18 (SUTURE) ×3 IMPLANT
SUT VIC AB 3-0 SH 8-18 (SUTURE) ×6 IMPLANT
SYR BULB 3OZ (MISCELLANEOUS) ×3 IMPLANT
SYR CONTROL 10ML LL (SYRINGE) ×3 IMPLANT
TOWEL OR 17X24 6PK STRL BLUE (TOWEL DISPOSABLE) ×3 IMPLANT
TOWEL OR 17X26 10 PK STRL BLUE (TOWEL DISPOSABLE) ×3 IMPLANT
TUBE CONNECTING 12'X1/4 (SUCTIONS) ×1
TUBE CONNECTING 12X1/4 (SUCTIONS) ×2 IMPLANT
UNDERPAD 30X30 INCONTINENT (UNDERPADS AND DIAPERS) IMPLANT
WATER STERILE IRR 1000ML POUR (IV SOLUTION) ×3 IMPLANT

## 2014-02-26 NOTE — Anesthesia Procedure Notes (Addendum)
Performed by: Shelton Silvas B   Procedure Name: LMA Insertion Date/Time: 02/26/2014 2:13 PM Performed by: [place] Pink Pre-anesthesia Checklist: Patient identified, Emergency Drugs available, Suction available, Patient being monitored and Timeout performed Patient Re-evaluated:Patient Re-evaluated prior to inductionOxygen Delivery Method: Circle system utilized Preoxygenation: Pre-oxygenation with 100% oxygen Intubation Type: IV induction Ventilation: Mask ventilation without difficulty LMA: LMA inserted LMA Size: 5.0 Number of attempts: 1 Placement Confirmation: positive ETCO2 and breath sounds checked- equal and bilateral Tube secured with: Tape Dental Injury: Teeth and Oropharynx as per pre-operative assessment

## 2014-02-26 NOTE — Anesthesia Preprocedure Evaluation (Signed)
Anesthesia Evaluation  Patient identified by MRN, date of birth, ID band Patient awake    Reviewed: Allergy & Precautions, H&P , NPO status , Patient's Chart, lab work & pertinent test results  Airway Mallampati: II TM Distance: >3 FB Neck ROM: Full    Dental  (+) Teeth Intact, Dental Advisory Given   Pulmonary former smoker,  breath sounds clear to auscultation        Cardiovascular hypertension, Rhythm:Regular Rate:Normal     Neuro/Psych    GI/Hepatic   Endo/Other    Renal/GU      Musculoskeletal   Abdominal   Peds  Hematology   Anesthesia Other Findings   Reproductive/Obstetrics                           Anesthesia Physical Anesthesia Plan  ASA: III  Anesthesia Plan: General   Post-op Pain Management:    Induction: Intravenous  Airway Management Planned: LMA  Additional Equipment:   Intra-op Plan:   Post-operative Plan:   Informed Consent: I have reviewed the patients History and Physical, chart, labs and discussed the procedure including the risks, benefits and alternatives for the proposed anesthesia with the patient or authorized representative who has indicated his/her understanding and acceptance.   Dental advisory given  Plan Discussed with: CRNA and Anesthesiologist  Anesthesia Plan Comments: (Parkinsonism for deep brain stimulator generator change )        Anesthesia Quick Evaluation

## 2014-02-26 NOTE — Op Note (Signed)
02/26/2014  3:08 PM  PATIENT:  Hunter Brown  63 y.o. male  PRE-OPERATIVE DIAGNOSIS:  Parkinsons Disease with DBS and depleted IPG  POST-OPERATIVE DIAGNOSIS:  Parkinsons Disease with DBS and depleted IPG  PROCEDURE:  Procedure(s): Deep Brain Stimulator battery replacement (N/A)  SURGEON:  Surgeon(s) and Role:    * Erline Levine, MD - Primary  PHYSICIAN ASSISTANT:   ASSISTANTS: none   ANESTHESIA:   general  EBL:     BLOOD ADMINISTERED:none  DRAINS: none   LOCAL MEDICATIONS USED:  LIDOCAINE   SPECIMEN:  No Specimen  DISPOSITION OF SPECIMEN:  N/A  COUNTS:  YES  TOURNIQUET:  * No tourniquets in log *  DICTATION: Patient has implanted subthalamic stimulator electrodes and IPG, which is now depleted.  It was elected for patient to undergo IPG revision.  PROCEDURE: Patient was brought to the operating room and given intravenous sedation and LMA anesthesia.  Right upper chest was prepped with betadine scrub and Duraprep.  Area of planned incision was infiltrated with lidocaine.  Prior incision was reopened and the old IPG was externalized.  Adaptor was connected to new IPG which was placed in the pocket.  Wound was irrigated with vancomycin irrigation. Then irrigated once more.  Incision was closed with 2-0 Vicryl and 3-0 vicryl sutures and dressed with Dermabond and a sterile occlusive dressing.  Impedances were checked and found to be satisfactory.Counts were correct at the end of the case.  PLAN OF CARE: Admit for overnight observation  PATIENT DISPOSITION:  PACU - hemodynamically stable.   Delay start of Pharmacological VTE agent (>24hrs) due to surgical blood loss or risk of bleeding: yes

## 2014-02-26 NOTE — Transfer of Care (Signed)
Immediate Anesthesia Transfer of Care Note  Patient: Hunter Brown  Procedure(s) Performed: Procedure(s): Deep Brain Stimulator battery replacement (N/A)  Patient Location: PACU  Anesthesia Type:General  Level of Consciousness: awake, alert , oriented and patient cooperative  Airway & Oxygen Therapy: Patient Spontanous Breathing and Patient connected to nasal cannula oxygen  Post-op Assessment: Report given to PACU RN, Post -op Vital signs reviewed and stable and Patient moving all extremities  Post vital signs: Reviewed and stable  Complications: No apparent anesthesia complications

## 2014-02-26 NOTE — Brief Op Note (Signed)
02/26/2014  3:08 PM  PATIENT:  Hunter Brown  63 y.o. male  PRE-OPERATIVE DIAGNOSIS:  Parkinsons Disease with DBS and depleted IPG  POST-OPERATIVE DIAGNOSIS:  Parkinsons Disease with DBS and depleted IPG  PROCEDURE:  Procedure(s): Deep Brain Stimulator battery replacement (N/A)  SURGEON:  Surgeon(s) and Role:    * Erline Levine, MD - Primary  PHYSICIAN ASSISTANT:   ASSISTANTS: none   ANESTHESIA:   general  EBL:     BLOOD ADMINISTERED:none  DRAINS: none   LOCAL MEDICATIONS USED:  LIDOCAINE   SPECIMEN:  No Specimen  DISPOSITION OF SPECIMEN:  N/A  COUNTS:  YES  TOURNIQUET:  * No tourniquets in log *  DICTATION: Patient has implanted subthalamic stimulator electrodes and IPG, which is now depleted.  It was elected for patient to undergo IPG revision.  PROCEDURE: Patient was brought to the operating room and given intravenous sedation and LMA anesthesia.  Right upper chest was prepped with betadine scrub and Duraprep.  Area of planned incision was infiltrated with lidocaine.  Prior incision was reopened and the old IPG was externalized.  Adaptor was connected to new IPG which was placed in the pocket.  Wound was irrigated with vancomycin irrigation. Then irrigated once more.  Incision was closed with 2-0 Vicryl and 3-0 vicryl sutures and dressed with Dermabond and a sterile occlusive dressing.  Counts were correct at the end of the case.  PLAN OF CARE: Admit for overnight observation  PATIENT DISPOSITION:  PACU - hemodynamically stable.   Delay start of Pharmacological VTE agent (>24hrs) due to surgical blood loss or risk of bleeding: yes

## 2014-02-26 NOTE — Progress Notes (Signed)
Reported patient's BP of 175/117 to Dr. Roberts Gaudy, and that patient is not currently taking BP meds.  Okay to send pt to holding area for assessment there.

## 2014-02-26 NOTE — Progress Notes (Signed)
Awake, alert, conversant.  MAEW.  Doing well.  

## 2014-02-26 NOTE — H&P (Signed)
Hunter Brown, [place] [address] Phone: [phone]   Patient ID:   [id number] Patient: Hunter Brown  Date of Birth: 04-20-1951 Visit Type: Office Visit   Date: 02/22/2014 03:15 PM Provider: Marchia Meiers. Vertell Limber MD   This 63 year old male presents for back pain.  History of Present Illness: 1.  back pain  Hunter Brown, [phone]. male, disabled since '01, visits for evaluation, with depleted IPG.  Hx Parkinson's, now seeing Dr. Carles Brown locally.   Hx: Parkinsons, HTN, peripheral neuropathy, scoiosis SxHx: tonsils, hemorrhoid, DBS  Patient has been receiving his care at [facility] but unfortunately because his wife works at [employer] day now regard him is going out of network if he goes back to [place] and consequently has come to me for battery change.  Original DBS surgery was approximately 10 years ago by Dr. Lissa Brown and subsequent battery change was done by Dr. Jonnie Brown about 5-1/2 years ago.  He has a depleted right upper chest [employer] IPG.        PAST MEDICAL/SURGICAL HISTORY   (Detailed)      PAST MEDICAL HISTORY, SURGICAL HISTORY, FAMILY HISTORY, SOCIAL HISTORY AND REVIEW OF SYSTEMS I have reviewed the patient's past medical, surgical, family and social history as well as the comprehensive review of systems as included on the [facility] history form dated, which I have signed.  Family History  (Detailed)  SOCIAL HISTORY  (Detailed) Tobacco use reviewed. Preferred language is [place].   Smoking status: Former smoker.  SMOKING STATUS Use Status Type Smoking Status Usage Per Day Years Used Total Pack Years  yes  Former smoker             MEDICATIONS(added, continued or stopped this visit):   ALLERGIES:  Ingredient Reaction Medication Name Comment  NO KNOWN ALLERGIES     No known allergies.  REVIEW OF SYSTEMS System Neg/Pos Details  Constitutional Negative Chills, fatigue, fever, malaise, night  sweats, weight gain and weight loss.  ENMT Negative Ear drainage, hearing loss, nasal drainage, otalgia, sinus pressure and sore throat.  Eyes Negative Eye discharge, eye pain and vision changes.  Respiratory Negative Chronic cough, cough, dyspnea, known TB exposure and wheezing.  Cardio Negative Chest pain, claudication, edema and irregular heartbeat/palpitations.  GI Negative Abdominal pain, blood in stool, change in stool pattern, constipation, decreased appetite, diarrhea, heartburn, nausea and vomiting.  GU Negative Dribbling, dysuria, erectile dysfunction, hematuria, polyuria, slow stream, urinary frequency, urinary incontinence and urinary retention.  Endocrine Negative Cold intolerance, heat intolerance, polydipsia and polyphagia.  Neuro Positive Tremors, Parkinsons.  Psych Negative Anxiety, depression and insomnia.  Integumentary Negative Brittle hair, brittle nails, change in shape/size of mole(s), hair loss, hirsutism, hives, pruritus, rash and skin lesion.  MS Negative Back pain, joint pain, joint swelling, muscle weakness and neck pain.  Hema/Lymph Negative Easy bleeding, easy bruising and lymphadenopathy.  Allergic/Immuno Negative Contact allergy, environmental allergies, food allergies and seasonal allergies.  Reproductive Negative Penile discharge and sexual dysfunction.    Vitals Date Temp F BP Pulse Ht In Wt Lb BMI BSA Pain Score  02/22/2014  161/88 73 78 189 21.84  6/10     PHYSICAL EXAM General Level of Distress: no acute distress Overall Appearance: normal  Head and Face  Right Left  Fundoscopic Exam:  normal normal    Cardiovascular Cardiac: regular rate and rhythm without murmur  Right Left  Carotid Pulses: normal normal  Respiratory Lungs: clear to auscultation  Neurological Orientation: normal  Recent and Remote Memory: normal Attention Span and Concentration:   normal Language: normal Fund of  Knowledge: normal  Right Left Sensation: normal normal Upper Extremity Coordination: normal normal  Lower Extremity Coordination: normal normal  Musculoskeletal Gait and Station: normal  Right Left Upper Extremity Muscle Strength: normal normal Lower Extremity Muscle Strength: normal normal Upper Extremity Muscle Tone:  tremor tremor Lower Extremity Muscle Tone: tremor tremor  Motor Strength Upper and lower extremity motor strength was tested in the clinically pertinent muscles.     Deep Tendon Reflexes  Right Left Biceps: normal normal Triceps: normal normal Brachiloradialis: normal normal Patellar: normal normal Achilles: normal normal  Cranial Nerves II. Optic Nerve/Visual Fields: normal III. Oculomotor: normal IV. Trochlear: normal V. Trigeminal: normal VI. Abducens: normal VII. Facial: normal VIII. Acoustic/Vestibular: normal IX. Glossopharyngeal: normal X. Vagus: normal XI. Spinal Accessory: normal XII. Hypoglossal: normal  Motor and other Tests Lhermittes: negative Rhomberg: negative Pronator drift: absent     Right Left Hoffman's: normal normal Clonus: normal normal Babinski: normal normal   Additional Findings:  Patient has mild baseline rest tremor and also dyskinesia is greater in his right upper extremity than left      IMPRESSION Depleted implantable pulse generator requiring revision for bilateral STN DBS  Completed Orders (this encounter) Order Details Reason Side Interpretation Result Initial Treatment Date Region  Hypertension education Continue to monitor blood pressure. If remains elevated, contact primary care physician.         Assessment/Plan # Detail Type Description   1. Assessment Parkinson disease (332.0).       2. Assessment Abnormal findings, elevated BP w/o HTN (796.2).         Pain Assessment/Treatment Pain Scale: 6/10. Method: Numeric Pain Intensity Scale. Location: back. Duration: varies. Quality:  discomforting. Pain Assessment/Treatment follow-up plan of care: Patient is currently taking over-the-counter pain relievers..  Battery revision early next week  Orders: Instruction(s)/Education: Assessment Instruction  796.2 Hypertension education             Provider:  Marchia Meiers. Vertell Limber MD  02/23/2014 05:02 PM Dictation edited by: Hunter Brown    CC Providers: Wells Guiles Tat [address] [place], [place] [address] ----------------------------------------------------------------------------------------------------------------------------------------------------------------------         Electronically signed by Hunter Meiers Vertell Limber MD on 02/23/2014 05:02 PM

## 2014-02-26 NOTE — Anesthesia Postprocedure Evaluation (Signed)
  Anesthesia Post-op Note  Patient: Hunter Brown  Procedure(s) Performed: Procedure(s): Deep Brain Stimulator battery replacement (N/A)  Patient Location: PACU  Anesthesia Type:General  Level of Consciousness: awake, alert  and oriented  Airway and Oxygen Therapy: Patient Spontanous Breathing and Patient connected to nasal cannula oxygen  Post-op Pain: mild  Post-op Assessment: Post-op Vital signs reviewed, Patient's Cardiovascular Status Stable, Respiratory Function Stable, Patent Airway and Pain level controlled  Post-op Vital Signs: stable  Last Vitals:  Filed Vitals:   02/26/14 1615  BP: 127/79  Pulse: 39  Temp:   Resp: 6    Complications: No apparent anesthesia complications

## 2014-02-27 ENCOUNTER — Encounter (HOSPITAL_COMMUNITY): Payer: Self-pay | Admitting: Neurosurgery

## 2014-02-27 NOTE — Discharge Summary (Signed)
Physician Discharge Summary  Patient ID: Hunter Brown MRN: [id number] DOB/AGE: March 24, 1951 63 y.o.  Admit date: 02/26/2014 Discharge date: 02/27/2014  Admission Diagnoses: Parkinsons Disease with DBS and depleted IPG   Discharge Diagnoses: Parkinsons Disease with DBS and depleted IPG s/p Deep Brain Stimulator battery replacement (N/A)  Active Problems:   Parkinson's disease   Discharged Condition: good  Hospital Course: Hunter Brown was admitted for deep brain stimulator battery change. Following uncomplicated surgery, he recovered nicely, pulse generator operational.  Consults: None  Significant Diagnostic Studies:   Treatments: surgery: Deep Brain Stimulator battery replacement (N/A)   Discharge Exam: Blood pressure 127/79, pulse 39, temperature 98.3 F (36.8 C), temperature source Oral, resp. rate 6, height 6\' 6"  (1.981 m), weight 85.8 kg (189 lb 2.5 oz), SpO2 83.00%. Awake, alert, conversant.  MAEW.  Doing well.         Disposition: 01-Home or Self Care He will call to schedule office follow up for incision check; otherwise following with Dr. Carles Collet.     Medication List         ALPRAZolam 0.5 MG tablet  Commonly known as:  XANAX  Take 0.5 mg by mouth 3 (three) times daily.     amantadine 100 MG capsule  Commonly known as:  SYMMETREL  Take 100 mg by mouth 3 (three) times daily.     AZILECT 1 MG Tabs tablet  Generic drug:  rasagiline  Take 1 mg by mouth daily.     baclofen 10 MG tablet  Commonly known as:  LIORESAL  Take 10 mg by mouth 3 (three) times daily.     carbidopa-levodopa 50-200 MG per tablet  Commonly known as:  SINEMET CR  Take 1 tablet by mouth 5 (five) times daily. 6 am, 10 am, 2 p, 6 p , 10p,     docusate sodium 100 MG capsule  Commonly known as:  COLACE  Take 100 mg by mouth 2 (two) times daily.     donepezil 5 MG tablet  Commonly known as:  ARICEPT  Take 5 mg by mouth at bedtime.     etodolac 400 MG tablet  Commonly known as:  LODINE  Take  400 mg by mouth 2 (two) times daily.     fludrocortisone 0.1 MG tablet  Commonly known as:  FLORINEF  Take 0.1 mg by mouth 2 (two) times daily.     potassium chloride SA 20 MEQ tablet  Commonly known as:  K-DUR,KLOR-CON  Take 40 mEq by mouth daily.     sildenafil 100 MG tablet  Commonly known as:  VIAGRA  Take 100 mg by mouth daily as needed for erectile dysfunction.           Follow-up Information   Follow up with Peggyann Shoals, MD.   Specialty:  Neurosurgery   Contact information:   [address] [place] [address] [phone]       Signed: Verdis Prime 02/27/2014, 11:53 AM

## 2014-03-06 ENCOUNTER — Telehealth: Payer: Self-pay | Admitting: Neurology

## 2014-03-06 NOTE — Telephone Encounter (Signed)
Legrand Como from [facility] called about medicare number please call [phone] opt [phone] ext [phone]

## 2014-03-06 NOTE — Telephone Encounter (Signed)
Spoke with [employer] - they state information for Medicare did not match with patient's name in the system - to call and get patient's correct information.

## 2014-03-07 NOTE — Telephone Encounter (Signed)
Will get a copy of medicare card at appt on Tuesday. Message sent to front desk.

## 2014-03-08 ENCOUNTER — Telehealth: Payer: Self-pay | Admitting: Neurology

## 2014-03-08 NOTE — Telephone Encounter (Signed)
Message copied by Annamaria Helling on Thu Mar 08, 2014  9:08 AM ------      Message from: TAT, [place] S      Created: Wed Mar 07, 2014  5:03 PM       Luvenia Starch,             Have patient hold his PD meds after his first morning dosages on Tuesday to allow me to reprogram more accurately ------

## 2014-03-08 NOTE — Telephone Encounter (Signed)
Patient made aware.

## 2014-03-13 ENCOUNTER — Ambulatory Visit (INDEPENDENT_AMBULATORY_CARE_PROVIDER_SITE_OTHER): Payer: 59 | Admitting: Neurology

## 2014-03-13 ENCOUNTER — Encounter: Payer: Self-pay | Admitting: Neurology

## 2014-03-13 VITALS — BP 130/72 | HR 80 | Ht 78.0 in | Wt 188.0 lb

## 2014-03-13 DIAGNOSIS — G20 Parkinson's disease: Secondary | ICD-10-CM

## 2014-03-13 DIAGNOSIS — R482 Apraxia: Secondary | ICD-10-CM

## 2014-03-13 DIAGNOSIS — G249 Dystonia, unspecified: Secondary | ICD-10-CM

## 2014-03-13 DIAGNOSIS — F0280 Dementia in other diseases classified elsewhere without behavioral disturbance: Secondary | ICD-10-CM

## 2014-03-13 NOTE — Procedures (Signed)
DBS Programming was performed.    Total time spent programming was 40 minutes.  Device was confirmed to be on.  Soft start was confirmed to be on.  Impedences were checked and were within normal limits.  Battery was checked and was determined to not be near the end of life.  Final settings were as follows:  Left brain electrode:     1-C+           ; Amplitude  2.0   V   ; Pulse width 60 microseconds;   Frequency   130   Hz.  Right brain electrode:     6-C+          ; Amplitude   2.0  V ;  Pulse width 60  microseconds;  Frequency   130    Hz.

## 2014-03-13 NOTE — Progress Notes (Addendum)
Hunter Brown was seen today in the movement disorders clinic for neurologic consultation at the request of [facility], [facility] K, MD.  The consultation is for the evaluation of PD.  The patient is seen today in the movement disorder clinic at the request of Dr. Manuella Ghazi.  The patient has previously seen Dr. Linus Mako as well.  I reviewed notes from both of them.  Patient was diagnosed with Parkinson's disease in 16 at the age of 63 years old.  Patient's first symptom was mild tremor in the R hand.  Pt states that his biggest problem was rigidity and that was what made him pursue DBS.  The patient underwent bilateral STN DBS on 04/13/2003.  This was done without microelectrode recording.  His generator was last replaced in 2009.    The patient is currently on carbidopa/levodopa 25/100, 6 tablets per day.  He is also on Azilect and amantadine.  Patient is on Aricept for memory changes and Florinef 1 mg twice a day for orthostatic hypotension.  03/13/14 update:  The patient is following up today, accompanied by his wife who supplements the history.  Pt on carbidopa/levodopa 25/100 six times per day.  Is also on azilect and amantadine.  He had his battery changed on 02/26/14.  Immediately after his battery change, however, he apparently developed dyskinesia even on the low settings that he was previously on and they turned him all the way back to a voltage of only 1.3 (on the left side where he was previously at 2.2).  Patient only took his exam dosage of medication today to allow me to program him this afternoon.  He feels okay on the right-hand side of his body, but states that he feels stiff on the left.  He continues to have some difficulty with spontaneous eye closure.  They asked me about getting a prior authorization for Botox.    ALLERGIES:  No Known Allergies  CURRENT MEDICATIONS:  Outpatient Encounter Prescriptions as of 03/13/2014  Medication Sig  . ALPRAZolam (XANAX) 0.5 MG tablet Take 0.5 mg by  mouth 3 (three) times daily.  Marland Kitchen amantadine (SYMMETREL) 100 MG capsule Take 100 mg by mouth 3 (three) times daily.  . baclofen (LIORESAL) 10 MG tablet Take 10 mg by mouth 3 (three) times daily.   . carbidopa-levodopa (SINEMET CR) 50-200 MG per tablet Take 1 tablet by mouth 5 (five) times daily. 6 am, 10 am, 2 p, 6 p , 10p,  . docusate sodium (COLACE) 100 MG capsule Take 100 mg by mouth 2 (two) times daily.  Marland Kitchen donepezil (ARICEPT) 5 MG tablet Take 5 mg by mouth at bedtime.  Marland Kitchen etodolac (LODINE) 400 MG tablet Take 400 mg by mouth 2 (two) times daily.  . fludrocortisone (FLORINEF) 0.1 MG tablet Take 0.1 mg by mouth 2 (two) times daily.  . potassium chloride SA (K-DUR,KLOR-CON) 20 MEQ tablet Take 40 mEq by mouth daily.   . rasagiline (AZILECT) 1 MG TABS tablet Take 1 mg by mouth daily.  . sildenafil (VIAGRA) 100 MG tablet Take 100 mg by mouth daily as needed for erectile dysfunction.    PAST MEDICAL HISTORY:   Past Medical History  Diagnosis Date  . Parkinson's disease   . Peripheral neuropathy   . Anxiety   . Scoliosis   . Osteoarthritis   . Nephrolithiasis   . Chest heaviness     started approximately one year ago 2014  . Shortness of breath     with exertion  .  Elevated serum creatinine   . Skin cancer of face     Face, nose  . Hypertension     PAST SURGICAL HISTORY:   Past Surgical History  Procedure Laterality Date  . Tonsillectomy    . Hemorroidectomy    . Burr hole w/ stereotactic insertion of dbs leads / intraop microelectrode recording    . Subthalamic stimulator battery replacement    . Back surgery      X 2  . Colonoscopy    . Upper gi endoscopy    . Subthalamic stimulator battery replacement N/A 02/26/2014    Procedure: Deep Brain Stimulator battery replacement;  Surgeon: Erline Levine, MD;  Location: [place] [facility];  Service: Neurosurgery;  Laterality: N/A;    SOCIAL HISTORY:   History   Social History  . Marital Status: Married    Spouse Name: N/A    Number of  Children: N/A  . Years of Education: N/A   Occupational History  . Not on file.   Social History Main Topics  . Smoking status: Former Smoker    Quit date: 02/14/1974  . Smokeless tobacco: Not on file  . Alcohol Use: No  . Drug Use: No  . Sexual Activity: Not on file   Other Topics Concern  . Not on file   Social History Narrative  . No narrative on file    FAMILY HISTORY:   Family Status  Relation Status Death Age  . Mother Deceased     brain tumor  . Father Deceased     heart attack, dementia  . Brother Deceased     cancer  . Sister Deceased     diabetes  . Brother Alive     prostate cancer  . Son Alive     healthy  . Son Alive     healthy    ROS:  A complete 10 system review of systems was obtained and was unremarkable apart from what is mentioned above.  PHYSICAL EXAMINATION:    VITALS:   Filed Vitals:   03/13/14 1323  BP: 130/72  Pulse: 80  Height: 6\' 6"  (1.981 m)  Weight: 188 lb (85.276 kg)    GEN:  The patient appears stated age and is in NAD. HEENT:  Normocephalic, atraumatic.  The mucous membranes are moist. The superficial temporal arteries are without ropiness or tenderness.  Has skin lesion near cap of the R scalp electrode with scap and surrounding erythematous rash. CV:  RRR Lungs:  CTAB Neck/HEME:  There are no carotid bruits bilaterally. Skin: Chest incision has healed well.  Neurological examination:  Orientation:  Montreal Cognitive Assessment  02/14/2014  Visuospatial/ Executive (0/5) 2  Naming (0/3) 2  Attention: Read list of digits (0/2) 2  Attention: Read list of letters (0/1) 1  Attention: Serial 7 subtraction starting at 100 (0/3) 3  Language: Repeat phrase (0/2) 2  Language : Fluency (0/1) 1  Abstraction (0/2) 2  Delayed Recall (0/5) 0  Orientation (0/6) 5  Total 20  Adjusted Score (based on education) 20    Cranial nerves: There is good facial symmetry. Pupils are equal round and reactive to light bilaterally.  Fundoscopic exam reveals clear margins bilaterally. Extraocular muscles are intact. Has eyelid opening apraxia.  The visual fields are full to confrontational testing. The speech is fluent and clear.  Has some word finding trouble.   Soft palate rises symmetrically and there is no tongue deviation. Hearing is intact to conversational tone. Sensation: Sensation is intact to  light touch throughout. Motor: Strength is 5/5 in the bilateral upper and lower extremities.   Shoulder shrug is equal and symmetric.  There is no pronator drift.   Movement examination: Tone: There is normal tone in the bilateral upper extremities.  The tone in the lower extremities is normal.  Abnormal movements: No dyskinesia noted today. Coordination:  There is mild decremation with RAM's, seen only with finger taps on the left. Gait and Station: The patient has minimal difficulty getting up today.  There is mild decreased arm swing on the left.  DS programming was performed today, which is described in more detail on a separate programming procedure note.    ASSESSMENT/PLAN:  1.  Idiopathic Parkinson's disease, diagnosed in 70.  -The patient is status post bilateral STN DBS in November, 2004, done without MER recording.  -The patients generator was last replaced on 02/26/14.  -I made significant programming changes today, which are described in more detail in a separate programming procedural notes.  I did increase his voltage on the left, and understand that he had some dyskinesia following his recent battery change, associated with the setting on the side.  I had him sit in the office for 30 minutes following programming and no dyskinesia developed.  However, if dyskinesia does develop, I will continue to drop levodopa and/or use a more superior contact.  -I. will have him decrease his carbidopa/levodopa from 6 tablets per day to 4-5 tablets per day.  I do plan to decrease the amantadine in the near future, but did not  want to make too many that once.  He will remain on Azilect for now. 2.  Eyelid opening apraxia and blepharospasm  -This is associated with Parkinson's disease, and can be exacerbated by DBS therapy.  I did talk about the value of Botox, especially if driving is a consideration.  They are interested in this.  I will first see if [facility] neurology does this. 3.  Sialorrhea.  -We discussed the value of Myobloc.  He thinks he may be interested.  Again, we will see if [facility] neurology does this. 4.  Parkinson's related memory changes.  -He is on Aricept.  I am hopeful that dropping medications can help as well. 5.  Scalp lesion near R DBS electrode  -reports been present for years but getting worse.  Told them that this could cause skin breakdown and infect lead.  Needs to call derm (has one) and get this checked out 6.  followup next week.  Will continue to follow with [facility] neurology as well.  Once I am done adjusting the programming, I will turn over full care to [facility] neurology.

## 2014-03-13 NOTE — Patient Instructions (Signed)
Decrease Carbidopa Levodopa to 4-5 tablets daily.  Follow up next week.

## 2014-03-13 NOTE — Progress Notes (Signed)
Note faxed to Dr Manuella Ghazi at [phone] with confirmation received.

## 2014-03-14 ENCOUNTER — Telehealth: Payer: Self-pay | Admitting: Neurology

## 2014-03-14 NOTE — Telephone Encounter (Signed)
Patient's wife made aware and they will contact Dr Manuella Ghazi about Botox.

## 2014-03-14 NOTE — Telephone Encounter (Signed)
Message copied by Annamaria Helling on Wed Mar 14, 2014 10:31 AM ------      Message from: TAT, [place]      Created: Tue Mar 13, 2014  4:09 PM       Ask pt/wife if they would like to go there instead.  They are going to maintain him as their regular neurologist.      ----- Message -----         From: Annamaria Helling, [place]: 03/13/2014   3:29 PM           To: Eustace Quail Tat, DO            I called and they said that Dr Manuella Ghazi does do this.                   ----- Message -----         From: Eustace Quail Tat, DO         Sent: 03/13/2014   3:06 PM           To: Annamaria Helling, CMA            Please call [facility] neurology and see if they do myobloc for drooling.  Pt interested but wanted to make sure not done there since he is their pt as well.  They wanted me to get auth to have done here but wanted to make sure.             ------

## 2014-03-22 ENCOUNTER — Ambulatory Visit: Payer: 59 | Admitting: Neurology

## 2014-03-22 ENCOUNTER — Ambulatory Visit: Payer: Self-pay | Admitting: Internal Medicine

## 2014-03-27 ENCOUNTER — Ambulatory Visit (INDEPENDENT_AMBULATORY_CARE_PROVIDER_SITE_OTHER): Payer: 59 | Admitting: Neurology

## 2014-03-27 ENCOUNTER — Encounter: Payer: Self-pay | Admitting: Neurology

## 2014-03-27 VITALS — BP 140/70 | HR 64 | Ht 78.0 in | Wt 187.0 lb

## 2014-03-27 DIAGNOSIS — K117 Disturbances of salivary secretion: Secondary | ICD-10-CM

## 2014-03-27 DIAGNOSIS — R482 Apraxia: Secondary | ICD-10-CM

## 2014-03-27 DIAGNOSIS — G20 Parkinson's disease: Secondary | ICD-10-CM

## 2014-03-27 DIAGNOSIS — F0280 Dementia in other diseases classified elsewhere without behavioral disturbance: Secondary | ICD-10-CM

## 2014-03-27 NOTE — Progress Notes (Signed)
Hunter Brown was seen today in the movement disorders clinic for neurologic consultation at the request of [facility], [facility] K, MD.  The consultation is for the evaluation of PD.  The patient is seen today in the movement disorder clinic at the request of Dr. Manuella Ghazi.  The patient has previously seen Dr. Linus Mako as well.  I reviewed notes from both of them.  Patient was diagnosed with Parkinson's disease in 10 at the age of 63 years old.  Patient's first symptom was mild tremor in the R hand.  Pt states that his biggest problem was rigidity and that was what made him pursue DBS.  The patient underwent bilateral STN DBS on 04/13/2003.  This was done without microelectrode recording.  His generator was last replaced in 2009.    The patient is currently on carbidopa/levodopa 25/100, 6 tablets per day.  He is also on Azilect and amantadine.  Patient is on Aricept for memory changes and Florinef 1 mg twice a day for orthostatic hypotension.  03/13/14 update:  The patient is following up today, accompanied by his wife who supplements the history.  Pt on carbidopa/levodopa 25/100 six times per day.  Is also on azilect and amantadine.  He had his battery changed on 02/26/14.  Immediately after his battery change, however, he apparently developed dyskinesia even on the low settings that he was previously on and they turned him all the way back to a voltage of only 1.3 (on the left side where he was previously at 2.2).  Patient only took his exam dosage of medication today to allow me to program him this afternoon.  He feels okay on the right-hand side of his body, but states that he feels stiff on the left.  He continues to have some difficulty with spontaneous eye closure.  They asked me about getting a prior authorization for Botox.  03/27/14 update:  The patient is following up today, accompanied by his wife who supplements the history.  Has done well until today, but that is only because hasn't had levodopa  since 4:30am today.  Has decreased his levodopa from 6 tablets per day to 5 tablets per day.  His wife asked me about potentially getting off of the Azilect, as he is going to hit the donut hole very early next year if he remains on it.  He has had no dyskinesia.  No falls.  Wife asks me about remaining in the amantadine and if he needs the medication.  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  I did discuss this with his primary neurologist since last visit, and the conclusion was that he would get this done here as opposed to [facility] neurology.    ALLERGIES:  No Known Allergies  CURRENT MEDICATIONS:  Outpatient Encounter Prescriptions as of 03/27/2014  Medication Sig  . ALPRAZolam (XANAX) 0.5 MG tablet Take 0.5 mg by mouth 3 (three) times daily.  Marland Kitchen amantadine (SYMMETREL) 100 MG capsule Take 100 mg by mouth 2 (two) times daily.   . baclofen (LIORESAL) 10 MG tablet Take 10 mg by mouth 3 (three) times daily.   . carbidopa-levodopa (SINEMET CR) 50-200 MG per tablet Take 1 tablet by mouth 5 (five) times daily. 6 am, 10 am, 2 p, 6 p , 10p,  . docusate sodium (COLACE) 100 MG capsule Take 100 mg by mouth 2 (two) times daily.  Marland Kitchen donepezil (ARICEPT) 5 MG tablet Take  5 mg by mouth at bedtime.  Marland Kitchen etodolac (LODINE) 400 MG tablet Take 400 mg by mouth 2 (two) times daily.  . fludrocortisone (FLORINEF) 0.1 MG tablet Take 0.1 mg by mouth 2 (two) times daily.  . potassium chloride SA (K-DUR,KLOR-CON) 20 MEQ tablet Take 40 mEq by mouth daily.   . rasagiline (AZILECT) 1 MG TABS tablet Take 1 mg by mouth daily.  . sildenafil (VIAGRA) 100 MG tablet Take 100 mg by mouth daily as needed for erectile dysfunction.    PAST MEDICAL HISTORY:   Past Medical History  Diagnosis Date  . Parkinson's disease   . Peripheral neuropathy   . Anxiety   . Scoliosis   . Osteoarthritis   . Nephrolithiasis   . Chest heaviness     started approximately one  year ago 2014  . Shortness of breath     with exertion  . Elevated serum creatinine   . Skin cancer of face     Face, nose  . Hypertension     PAST SURGICAL HISTORY:   Past Surgical History  Procedure Laterality Date  . Tonsillectomy    . Hemorroidectomy    . Burr hole w/ stereotactic insertion of dbs leads / intraop microelectrode recording    . Subthalamic stimulator battery replacement    . Back surgery      X 2  . Colonoscopy    . Upper gi endoscopy    . Subthalamic stimulator battery replacement N/A 02/26/2014    Procedure: Deep Brain Stimulator battery replacement;  Surgeon: Erline Levine, MD;  Location: [place] [facility];  Service: Neurosurgery;  Laterality: N/A;    SOCIAL HISTORY:   History   Social History  . Marital Status: Married    Spouse Name: N/A    Number of Children: N/A  . Years of Education: N/A   Occupational History  . Not on file.   Social History Main Topics  . Smoking status: Former Smoker    Quit date: 02/14/1974  . Smokeless tobacco: Not on file  . Alcohol Use: No  . Drug Use: No  . Sexual Activity: Not on file   Other Topics Concern  . Not on file   Social History Narrative  . No narrative on file    FAMILY HISTORY:   Family Status  Relation Status Death Age  . Mother Deceased     brain tumor  . Father Deceased     heart attack, dementia  . Brother Deceased     cancer  . Sister Deceased     diabetes  . Brother Alive     prostate cancer  . Son Alive     healthy  . Son Alive     healthy    ROS:  A complete 10 system review of systems was obtained and was unremarkable apart from what is mentioned above.  PHYSICAL EXAMINATION:    VITALS:   Filed Vitals:   03/27/14 1004  BP: 140/70  Pulse: 64  Height: 6\' 6"  (1.981 m)  Weight: 187 lb (84.823 kg)    GEN:  The patient appears stated age and is in NAD. HEENT:  Normocephalic, atraumatic.  The mucous membranes are moist. The superficial temporal arteries are without ropiness  or tenderness.  Has skin lesion near cap of the R scalp electrode with scap and surrounding erythematous rash. CV:  RRR Lungs:  CTAB Neck/HEME:  There are no carotid bruits bilaterally.  Neurological examination:  Orientation: troulbe providing hx today and relies  on wife.  Word finding trouble as well. Montreal Cognitive Assessment  02/14/2014  Visuospatial/ Executive (0/5) 2  Naming (0/3) 2  Attention: Read list of digits (0/2) 2  Attention: Read list of letters (0/1) 1  Attention: Serial 7 subtraction starting at 100 (0/3) 3  Language: Repeat phrase (0/2) 2  Language : Fluency (0/1) 1  Abstraction (0/2) 2  Delayed Recall (0/5) 0  Orientation (0/6) 5  Total 20  Adjusted Score (based on education) 20    Cranial nerves: There is good facial symmetry. Pupils are equal round and reactive to light bilaterally. Fundoscopic exam reveals clear margins bilaterally. Extraocular muscles are intact. Has eyelid opening apraxia.  The visual fields are full to confrontational testing. The speech is fluent and clear.  Has some word finding trouble.   Soft palate rises symmetrically and there is no tongue deviation. Hearing is intact to conversational tone. Sensation: Sensation is intact to light touch throughout. Motor: Strength is 5/5 in the bilateral upper and lower extremities.   Shoulder shrug is equal and symmetric.  There is no pronator drift.   Movement examination: Tone: There is normal tone in the bilateral upper extremities.  The tone in the lower extremities is normal.  Abnormal movements: No dyskinesia noted today. Coordination:  There is no decremation with RAM's today Gait and Station: The patient has no difficulty getting up today.  There is good stride length and mild postural instability  DS programming was performed today, which is described in more detail on a separate programming procedure note.    ASSESSMENT/PLAN:  1.  Idiopathic Parkinson's disease, diagnosed in  61.  -The patient is status post bilateral STN DBS in November, 2004, done without MER recording.  -The patients generator was last replaced on 02/26/14.  -I. will have him decrease his carbidopa/levodopa from 6 tablets per day to 4 tablets per day.  D/c azilect due to cost.  Will consider decrease amantadine in future due to cognitive deficit. 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  botox auth pending. 3.  Sialorrhea.  -We discussed the value of Myobloc.  He is not interested yet as not bad enough 4.  Parkinson's related memory changes.  -He is on Aricept.  I am hopeful that dropping medications can help as well. 5.  Scalp lesion near R DBS electrode  -reports been present for years but getting worse.  Told them that this could cause skin breakdown and infect lead.  Needs to call derm (has one) and get this checked out 6.  followup in 4 weeks.  If good then, can f/u with Dr. Manuella Ghazi and I can just see him for botox injections.

## 2014-03-27 NOTE — Procedures (Signed)
DBS Programming was performed.    Total time spent programming was 40 minutes.  Device was confirmed to be on.  Soft start was confirmed to be on.  Impedences were checked and were within normal limits.  Battery was checked and was determined to not be near the end of life.  Final settings were as follows:  Left brain electrode:     1-C+           ; Amplitude  2.4   V   ; Pulse width 60 microseconds;   Frequency   140   Hz. (transient lightheadedness)  Right brain electrode:     6-C+          ; Amplitude   2.2  V ;  Pulse width 60  microseconds;  Frequency   140    Hz. (had some paresthesias on the L with this electrode so did not push voltage up further)

## 2014-03-29 ENCOUNTER — Encounter: Payer: Self-pay | Admitting: Neurology

## 2014-03-29 ENCOUNTER — Ambulatory Visit (INDEPENDENT_AMBULATORY_CARE_PROVIDER_SITE_OTHER): Payer: 59 | Admitting: Neurology

## 2014-03-29 VITALS — BP 142/90 | HR 64 | Wt 189.0 lb

## 2014-03-29 DIAGNOSIS — G249 Dystonia, unspecified: Secondary | ICD-10-CM

## 2014-03-29 DIAGNOSIS — G20 Parkinson's disease: Secondary | ICD-10-CM

## 2014-03-29 DIAGNOSIS — F0280 Dementia in other diseases classified elsewhere without behavioral disturbance: Secondary | ICD-10-CM

## 2014-03-29 NOTE — Procedures (Signed)
DBS Programming was performed.    Total time spent programming was 30 minutes.  Device was confirmed to be on.  Soft start was confirmed to be on.  Impedences were checked and were within normal limits.  Battery was checked (3.03 V) and was determined to not be near the end of life.  Final settings were as follows:  Left brain electrode:     1-C+           ; Amplitude  2.1   V   ; Pulse width 60 microseconds;   Frequency   130   Hz. (transient lightheadedness)  Right brain electrode:     6-C+          ; Amplitude   2.1  V ;  Pulse width 60  microseconds;  Frequency   130    Hz. (had some paresthesias on the L with this electrode so did not push voltage up further)

## 2014-03-29 NOTE — Progress Notes (Signed)
Hunter Brown was seen today in the movement disorders clinic for neurologic consultation at the request of [facility], [facility] K, MD.  The consultation is for the evaluation of PD.  The patient is seen today in the movement disorder clinic at the request of Dr. Manuella Ghazi.  The patient has previously seen Dr. Linus Mako as well.  I reviewed notes from both of them.  Patient was diagnosed with Parkinson's disease in 44 at the age of 63 years old.  Patient's first symptom was mild tremor in the R hand.  Pt states that his biggest problem was rigidity and that was what made him pursue DBS.  The patient underwent bilateral STN DBS on 04/13/2003.  This was done without microelectrode recording.  His generator was last replaced in 2009.    The patient is currently on carbidopa/levodopa 25/100, 6 tablets per day.  He is also on Azilect and amantadine.  Patient is on Aricept for memory changes and Florinef 1 mg twice a day for orthostatic hypotension.  03/13/14 update:  The patient is following up today, accompanied by his wife who supplements the history.  Pt on carbidopa/levodopa 25/100 six times per day.  Is also on azilect and amantadine.  He had his battery changed on 02/26/14.  Immediately after his battery change, however, he apparently developed dyskinesia even on the low settings that he was previously on and they turned him all the way back to a voltage of only 1.3 (on the left side where he was previously at 2.2).  Patient only took his exam dosage of medication today to allow me to program him this afternoon.  He feels okay on the right-hand side of his body, but states that he feels stiff on the left.  He continues to have some difficulty with spontaneous eye closure.  They asked me about getting a prior authorization for Botox.  03/27/14 update:  The patient is following up today, accompanied by his wife who supplements the history.  Has done well until today, but that is only because hasn't had levodopa  since 4:30am today.  Has decreased his levodopa from 6 tablets per day to 5 tablets per day.  His wife asked me about potentially getting off of the Azilect, as he is going to hit the donut hole very early next year if he remains on it.  He has had no dyskinesia.  No falls.  Wife asks me about remaining in the amantadine and if he needs the medication.  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  I did discuss this with his primary neurologist since last visit, and the conclusion was that he would get this done here as opposed to [facility] neurology.  03/29/14 update:  Pt was seen unexpectedly today.  He is accompanied by his wife who supplements the history.  Apparently, before he then left the office last visit, he dropped his own stimulation to 2.1 volts bilaterally, but also took a levodopa at the same time and then became quite dyskinetic.  This has been the pattern over the last 2 days.  Every time he takes his levodopa, he he comes very dyskinetic.  It turns out that although I thought he was taking carbidopa/levodopa 25/100, he is actually taking carbidopa/levodopa 50/200 with each dosage.  He does not necessarily feel like he needs the dosage, but was taking it "when it was due."  He took his last dose today  at 11:50 AM and at 11:30 AM he was not dyskinetic, but by the time I saw him at 2 PM, he was very dyskinetic.  He has been taking about 5 of the carbidopa/levodopa 50/200 per day.  He is off of the Azilect.    ALLERGIES:  No Known Allergies  CURRENT MEDICATIONS:  Outpatient Encounter Prescriptions as of 03/29/2014  Medication Sig  . ALPRAZolam (XANAX) 0.5 MG tablet Take 0.5 mg by mouth 3 (three) times daily.  Marland Kitchen amantadine (SYMMETREL) 100 MG capsule Take 100 mg by mouth 2 (two) times daily.   . baclofen (LIORESAL) 10 MG tablet Take 10 mg by mouth 3 (three) times daily.   . carbidopa-levodopa (SINEMET CR) 50-200 MG per tablet  Take 1 tablet by mouth 5 (five) times daily. 6 am, 10 am, 2 p, 6 p , 10p,  . docusate sodium (COLACE) 100 MG capsule Take 100 mg by mouth 2 (two) times daily.  Marland Kitchen donepezil (ARICEPT) 5 MG tablet Take 5 mg by mouth at bedtime.  Marland Kitchen etodolac (LODINE) 400 MG tablet Take 400 mg by mouth 2 (two) times daily.  . fludrocortisone (FLORINEF) 0.1 MG tablet Take 0.1 mg by mouth 2 (two) times daily.  . potassium chloride SA (K-DUR,KLOR-CON) 20 MEQ tablet Take 40 mEq by mouth daily.   . rasagiline (AZILECT) 1 MG TABS tablet Take 1 mg by mouth daily.  . sildenafil (VIAGRA) 100 MG tablet Take 100 mg by mouth daily as needed for erectile dysfunction.    PAST MEDICAL HISTORY:   Past Medical History  Diagnosis Date  . Parkinson's disease   . Peripheral neuropathy   . Anxiety   . Scoliosis   . Osteoarthritis   . Nephrolithiasis   . Chest heaviness     started approximately one year ago 2014  . Shortness of breath     with exertion  . Elevated serum creatinine   . Skin cancer of face     Face, nose  . Hypertension     PAST SURGICAL HISTORY:   Past Surgical History  Procedure Laterality Date  . Tonsillectomy    . Hemorroidectomy    . Burr hole w/ stereotactic insertion of dbs leads / intraop microelectrode recording    . Subthalamic stimulator battery replacement    . Back surgery      X 2  . Colonoscopy    . Upper gi endoscopy    . Subthalamic stimulator battery replacement N/A 02/26/2014    Procedure: Deep Brain Stimulator battery replacement;  Surgeon: Erline Levine, MD;  Location: [place] [facility];  Service: Neurosurgery;  Laterality: N/A;    SOCIAL HISTORY:   History   Social History  . Marital Status: Married    Spouse Name: N/A    Number of Children: N/A  . Years of Education: N/A   Occupational History  . Not on file.   Social History Main Topics  . Smoking status: Former Smoker    Quit date: 02/14/1974  . Smokeless tobacco: Not on file  . Alcohol Use: No  . Drug Use: No  .  Sexual Activity: Not on file   Other Topics Concern  . Not on file   Social History Narrative  . No narrative on file    FAMILY HISTORY:   Family Status  Relation Status Death Age  . Mother Deceased     brain tumor  . Father Deceased     heart attack, dementia  . Brother Deceased  cancer  . Sister Deceased     diabetes  . Brother Alive     prostate cancer  . Son Alive     healthy  . Son Alive     healthy    ROS:  A complete 10 system review of systems was obtained and was unremarkable apart from what is mentioned above.  PHYSICAL EXAMINATION:    VITALS:   Filed Vitals:   03/29/14 1403  BP: 142/90  Pulse: 64  Weight: 189 lb (85.73 kg)    GEN:  The patient appears stated age and is in NAD. HEENT:  Normocephalic, atraumatic.  The mucous membranes are moist. The superficial temporal arteries are without ropiness or tenderness.  Has skin lesion near cap of the R scalp electrode with scap and surrounding erythematous rash. CV:  RRR Lungs:  CTAB Neck/HEME:  There are no carotid bruits bilaterally.  Neurological examination:  Orientation: troulbe providing hx today and relies on wife.  Word finding trouble as well. Montreal Cognitive Assessment  02/14/2014  Visuospatial/ Executive (0/5) 2  Naming (0/3) 2  Attention: Read list of digits (0/2) 2  Attention: Read list of letters (0/1) 1  Attention: Serial 7 subtraction starting at 100 (0/3) 3  Language: Repeat phrase (0/2) 2  Language : Fluency (0/1) 1  Abstraction (0/2) 2  Delayed Recall (0/5) 0  Orientation (0/6) 5  Total 20  Adjusted Score (based on education) 20    Cranial nerves: There is good facial symmetry. Pupils are equal round and reactive to light bilaterally. Fundoscopic exam reveals clear margins bilaterally. Extraocular muscles are intact. Has eyelid opening apraxia.  The visual fields are full to confrontational testing. The speech is fluent and clear.  Has some word finding trouble.   Soft  palate rises symmetrically and there is no tongue deviation. Hearing is intact to conversational tone. Sensation: Sensation is intact to light touch throughout. Motor: Strength is 5/5 in the bilateral upper and lower extremities.   Shoulder shrug is equal and symmetric.  There is no pronator drift.   Movement examination: Tone: There is normal tone in the bilateral upper extremities.  The tone in the lower extremities is normal.  Abnormal movements: The patient is very dyskinetic today in both the arms and legs and even in the face. Coordination:  There is no decremation with RAM's today Gait and Station: The patient has no difficulty getting up today.  There is good stride length and mild postural instability  DBS programming was performed today, which is described in more detail on a separate programming procedure note.    ASSESSMENT/PLAN:  1.  Idiopathic Parkinson's disease, diagnosed in 25.  -The patient is status post bilateral STN DBS in November, 2004, done without MER recording.  -The patients generator was last replaced on 02/26/14.  -As above, I did not know that he was on the carbidopa/levodopa 50/200, 5 times per day and thought he was on carbidopa/levodopa 25/100.  I told him to not take the carbidopa/levodopa unless he feels like he needs it.  When he feels like he needs it I asked him to go ahead and take the carbidopa/levodopa 25/100 that he has at home.  He is off of the Azilect.  He remains on the amantadine, which is good since he has dyskinesia. 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  botox auth pending. 3.  Sialorrhea.  -We discussed the value of Myobloc.  He is not interested yet as not bad enough 4.  Parkinson's related memory changes.  -He is on Aricept.  I am hopeful that dropping medications can help as well. 5.  Scalp lesion near R DBS electrode  -reports been present for years but getting  worse.  Told them that this could cause skin breakdown and infect lead.  I reiterated the importance today of getting a dermatology appointment 6.  followup at his scheduled appointment in 3 weeks.  If good then, can f/u with Dr. Manuella Ghazi and I can just see him for botox injections.

## 2014-03-30 ENCOUNTER — Telehealth: Payer: Self-pay | Admitting: *Deleted

## 2014-03-30 MED ORDER — CARBIDOPA-LEVODOPA 25-100 MG PO TABS: 1.0000 | ORAL_TABLET | Freq: Every day | ORAL | Status: DC

## 2014-03-30 NOTE — Telephone Encounter (Signed)
RX sent to pharmacy  

## 2014-03-30 NOTE — Telephone Encounter (Signed)
Entered in error

## 2014-03-30 NOTE — Telephone Encounter (Signed)
Patient needs a refill on his Sinemet 25/100 take one tablet by mouth 5 (X) daily. Please send to the [facility]

## 2014-04-04 ENCOUNTER — Telehealth: Payer: Self-pay | Admitting: Neurology

## 2014-04-04 NOTE — Telephone Encounter (Signed)
Called to make them aware Botox approved. They will call back later to schedule on December 4th Botox day.

## 2014-04-05 NOTE — Telephone Encounter (Signed)
Botox appt scheduled.

## 2014-04-09 ENCOUNTER — Ambulatory Visit: Payer: Self-pay | Admitting: Nephrology

## 2014-04-18 ENCOUNTER — Ambulatory Visit (INDEPENDENT_AMBULATORY_CARE_PROVIDER_SITE_OTHER): Payer: 59 | Admitting: Neurology

## 2014-04-18 ENCOUNTER — Encounter: Payer: Self-pay | Admitting: Neurology

## 2014-04-18 VITALS — BP 140/70 | HR 80 | Ht 78.0 in | Wt 189.0 lb

## 2014-04-18 DIAGNOSIS — G20 Parkinson's disease: Secondary | ICD-10-CM

## 2014-04-18 DIAGNOSIS — F0280 Dementia in other diseases classified elsewhere without behavioral disturbance: Secondary | ICD-10-CM

## 2014-04-18 DIAGNOSIS — R482 Apraxia: Secondary | ICD-10-CM

## 2014-04-18 NOTE — Progress Notes (Signed)
Hunter Brown was seen today in the movement disorders clinic for neurologic consultation at the request of [facility], [facility] K, MD.  The consultation is for the evaluation of PD.  The patient is seen today in the movement disorder clinic at the request of Dr. Manuella Ghazi.  The patient has previously seen Dr. Linus Mako as well.  I reviewed notes from both of them.  Patient was diagnosed with Parkinson's disease in 63 at the age of 63 years old.  Patient's first symptom was mild tremor in the R hand.  Pt states that his biggest problem was rigidity and that was what made him pursue DBS.  The patient underwent bilateral STN DBS on 04/13/2003.  This was done without microelectrode recording.  His generator was last replaced in 2009.    The patient is currently on carbidopa/levodopa 25/100, 6 tablets per day.  He is also on Azilect and amantadine.  Patient is on Aricept for memory changes and Florinef 1 mg twice a day for orthostatic hypotension.  03/13/14 update:  The patient is following up today, accompanied by his wife who supplements the history.  Pt on carbidopa/levodopa 25/100 six times per day.  Is also on azilect and amantadine.  He had his battery changed on 02/26/14.  Immediately after his battery change, however, he apparently developed dyskinesia even on the low settings that he was previously on and they turned him all the way back to a voltage of only 1.3 (on the left side where he was previously at 2.2).  Patient only took his exam dosage of medication today to allow me to program him this afternoon.  He feels okay on the right-hand side of his body, but states that he feels stiff on the left.  He continues to have some difficulty with spontaneous eye closure.  They asked me about getting a prior authorization for Botox.  03/27/14 update:  The patient is following up today, accompanied by his wife who supplements the history.  Has done well until today, but that is only because hasn't had levodopa  since 4:30am today.  Has decreased his levodopa from 6 tablets per day to 5 tablets per day.  His wife asked me about potentially getting off of the Azilect, as he is going to hit the donut hole very early next year if he remains on it.  He has had no dyskinesia.  No falls.  Wife asks me about remaining in the amantadine and if he needs the medication.  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  I did discuss this with his primary neurologist since last visit, and the conclusion was that he would get this done here as opposed to [facility] neurology.  03/29/14 update:  Pt was seen unexpectedly today.  He is accompanied by his wife who supplements the history.  Apparently, before he then left the office last visit, he dropped his own stimulation to 2.1 volts bilaterally, but also took a levodopa at the same time and then became quite dyskinetic.  This has been the pattern over the last 2 days.  Every time he takes his levodopa, he he comes very dyskinetic.  It turns out that although I thought he was taking carbidopa/levodopa 25/100, he is actually taking carbidopa/levodopa 50/200 with each dosage.  He does not necessarily feel like he needs the dosage, but was taking it "when it was due."  He took his last dose today  at 11:50 AM and at 11:30 AM he was not dyskinetic, but by the time I saw him at 2 PM, he was very dyskinetic.  He has been taking about 5 of the carbidopa/levodopa 50/200 per day.  He is off of the Azilect.  04/18/14 update:  Pt is seen today, accompanied by wife who supplements the history.  Down to carbidopa/levodopa 25/100, four times per day.  Rarely takes 5 per day.  Last took carbidopa/levodopa 25/100 at 5 am and was seen at 10:45 am today.  Went back to 25/100 from the 50/200.  Still on amantadine 100 mg bid.  Very little dyskinesia.  Seems to take levodopa more further control of back pain than any other symptom.  For some  reason, he thinks that it helps his low back pain.  No falls since last visit.  No hallucinations.  His eyelids seem to be staying open better than in the past.  Has not yet been back to the dermatologist.    ALLERGIES:  No Known Allergies  CURRENT MEDICATIONS:  Outpatient Encounter Prescriptions as of 04/18/2014  Medication Sig  . ALPRAZolam (XANAX) 0.5 MG tablet Take 0.5 mg by mouth 3 (three) times daily.  Marland Kitchen amantadine (SYMMETREL) 100 MG capsule Take 100 mg by mouth 2 (two) times daily.   . baclofen (LIORESAL) 10 MG tablet Take 10 mg by mouth 3 (three) times daily.   . carbidopa-levodopa (SINEMET IR) 25-100 MG per tablet Take 1 tablet by mouth 5 (five) times daily.  Marland Kitchen docusate sodium (COLACE) 100 MG capsule Take 100 mg by mouth 2 (two) times daily.  Marland Kitchen donepezil (ARICEPT) 5 MG tablet Take 5 mg by mouth at bedtime.  Marland Kitchen etodolac (LODINE) 400 MG tablet Take 400 mg by mouth 2 (two) times daily.  . fludrocortisone (FLORINEF) 0.1 MG tablet Take 0.1 mg by mouth 2 (two) times daily.  . potassium chloride SA (K-DUR,KLOR-CON) 20 MEQ tablet Take 40 mEq by mouth daily.   . sildenafil (VIAGRA) 100 MG tablet Take 100 mg by mouth daily as needed for erectile dysfunction.  . [DISCONTINUED] carbidopa-levodopa (SINEMET CR) 50-200 MG per tablet Take 1 tablet by mouth 5 (five) times daily. 6 am, 10 am, 2 p, 6 p , 10p,  . [DISCONTINUED] rasagiline (AZILECT) 1 MG TABS tablet Take 1 mg by mouth daily.    PAST MEDICAL HISTORY:   Past Medical History  Diagnosis Date  . Parkinson's disease   . Peripheral neuropathy   . Anxiety   . Scoliosis   . Osteoarthritis   . Nephrolithiasis   . Chest heaviness     started approximately one year ago 2014  . Shortness of breath     with exertion  . Elevated serum creatinine   . Skin cancer of face     Face, nose  . Hypertension     PAST SURGICAL HISTORY:   Past Surgical History  Procedure Laterality Date  . Tonsillectomy    . Hemorroidectomy    . Burr hole w/  stereotactic insertion of dbs leads / intraop microelectrode recording    . Subthalamic stimulator battery replacement    . Back surgery      X 2  . Colonoscopy    . Upper gi endoscopy    . Subthalamic stimulator battery replacement N/A 02/26/2014    Procedure: Deep Brain Stimulator battery replacement;  Surgeon: Erline Levine, MD;  Location: [place] [facility];  Service: Neurosurgery;  Laterality: N/A;    SOCIAL HISTORY:   History  Social History  . Marital Status: Married    Spouse Name: N/A    Number of Children: N/A  . Years of Education: N/A   Occupational History  . Not on file.   Social History Main Topics  . Smoking status: Former Smoker    Quit date: 02/14/1974  . Smokeless tobacco: Not on file  . Alcohol Use: No  . Drug Use: No  . Sexual Activity: Not on file   Other Topics Concern  . Not on file   Social History Narrative    FAMILY HISTORY:   Family Status  Relation Status Death Age  . Mother Deceased     brain tumor  . Father Deceased     heart attack, dementia  . Brother Deceased     cancer  . Sister Deceased     diabetes  . Brother Alive     prostate cancer  . Son Alive     healthy  . Son Alive     healthy    ROS:  A complete 10 system review of systems was obtained and was unremarkable apart from what is mentioned above.  PHYSICAL EXAMINATION:    VITALS:   Filed Vitals:   04/18/14 1031  BP: 140/70  Pulse: 80  Height: 6\' 6"  (1.981 m)  Weight: 189 lb (85.73 kg)    GEN:  The patient appears stated age and is in NAD. HEENT:  Normocephalic, atraumatic.  The mucous membranes are moist. The superficial temporal arteries are without ropiness or tenderness.  Has skin lesion near cap of the R scalp electrode with scap and surrounding erythematous rash. CV:  RRR Lungs:  CTAB Neck/HEME:  There are no carotid bruits bilaterally.  Neurological examination:  Orientation: he is alert and oriented 3 today.  He is doing much better with his words  today. Montreal Cognitive Assessment  02/14/2014  Visuospatial/ Executive (0/5) 2  Naming (0/3) 2  Attention: Read list of digits (0/2) 2  Attention: Read list of letters (0/1) 1  Attention: Serial 7 subtraction starting at 100 (0/3) 3  Language: Repeat phrase (0/2) 2  Language : Fluency (0/1) 1  Abstraction (0/2) 2  Delayed Recall (0/5) 0  Orientation (0/6) 5  Total 20  Adjusted Score (based on education) 20    Cranial nerves: There is good facial symmetry. Pupils are equal round and reactive to light bilaterally. Fundoscopic exam reveals clear margins bilaterally. Extraocular muscles are intact.  No eyelid opening apraxia today.  The visual fields are full to confrontational testing. The speech is fluent and clear.  Less word finding difficulty.  Soft palate rises symmetrically and there is no tongue deviation. Hearing is intact to conversational tone. Sensation: Sensation is intact to light touch throughout. Motor: Strength is 5/5 in the bilateral upper and lower extremities.   Shoulder shrug is equal and symmetric.  There is no pronator drift.   Movement examination: Tone: There is normal tone in the bilateral upper extremities.  The tone in the lower extremities is normal.  Abnormal movements: no dyskinesia or tremor today. Coordination:  There is no decremation with RAM's today Gait and Station: The patient has no difficulty getting up today.  There is good stride length and mild postural instability  DBS programming was performed today, which is described in more detail on a separate programming procedure note.    ASSESSMENT/PLAN:  1.  Idiopathic Parkinson's disease, diagnosed in 63.  -The patient is status post bilateral STN DBS in November, 2004,  done without MER recording.  -The patients generator was last replaced on 02/26/14.  -the patient has been able to reduce his levodopa load from carbidopa/levodopa 50/200 5 times per day to carbidopa/levodopa 25/100, 4-5 times per  day.  I really think that he could probably reduce the levodopa further as I saw no evidence today that he needed it, and he was 6 hours after his dose.  I think that he was having dopamine dysregulation syndrome, but I also think he has done well in trying to decrease it.  He is currently, however, using the levodopa for the treatment of back pain and I think that this is a bad idea.  I encouraged him to follow-up with his pain management specialist.  I would decrease the levodopa to 3 or 4 tablets per day. 2.  Eyelid opening apraxia/blepharospasm.  -This is associated with Parkinson's disease, and can be exacerbated by DBS therapy but he has actually done better with reprogramming and wants to hold on the Botox. 3.  Sialorrhea.  -We discussed the value of Myobloc.  He is not interested yet as not bad enough 4.  Parkinson's related memory changes.  -He is on Aricept.  I am hopeful that dropping medications can help as well. 5.  Scalp lesion near R DBS electrode  -reports been present for years but getting worse.  Told them that this could cause skin breakdown and infect lead.  I actually called his dermatologist today to make a follow-up appointment. 6.  I told them that he was actually good to follow-up with Dr. Manuella Ghazi from now on, but his wife actually wants me to see him until he can hopefully get off of the levodopa and then they will return to his prior neurologist.

## 2014-04-18 NOTE — Patient Instructions (Addendum)
1. Decrease Levodopa to 3-4 tablets daily.  2. Left a message with [facility] to call us back for appt. They can be reached at [phone]. 3. Botox appt cancelled. Your insurance approval is valid through 03/02/2015.

## 2014-04-18 NOTE — Procedures (Signed)
DBS Programming was performed.    Total time spent programming was 30 minutes.  Device was confirmed to be on.  Soft start was confirmed to be on.  Impedences were checked and were within normal limits.  Battery was checked (3.02 V) and was determined to not be near the end of life.  Final settings were as follows:  Left brain electrode:     1-C+           ; Amplitude  2.2   V   ; Pulse width 60 microseconds;   Frequency   140   Hz. (transient lightheadedness)  Right brain electrode:     6-C+          ; Amplitude   2.1  V ;  Pulse width 60  microseconds;  Frequency   140    Hz. (had some paresthesias on the L with this electrode so did not push voltage up further)

## 2014-04-20 ENCOUNTER — Ambulatory Visit: Payer: 59 | Admitting: Neurology

## 2014-04-23 ENCOUNTER — Ambulatory Visit: Payer: 59 | Admitting: Neurology

## 2014-05-04 ENCOUNTER — Ambulatory Visit: Payer: 59 | Admitting: Neurology

## 2014-05-07 ENCOUNTER — Telehealth: Payer: Self-pay | Admitting: Neurology

## 2014-05-07 NOTE — Telephone Encounter (Signed)
I have a 30 min slot wed if they want that or they can try going up 0.1 V bilaterally and that is it

## 2014-05-07 NOTE — Telephone Encounter (Signed)
Spoke with patient's wife. She states he is down to 4 tablets of Sinemet a day- but feels like his DBS needs adjusted a little bit. They didn't want to change without talking to you. They want to know if they should increase this any or move up appt from February. Please advise.

## 2014-05-07 NOTE — Telephone Encounter (Signed)
Pt wife called and wants to talk to [facility] or Dr Tat about medication and DBS  [phone] pt wife name is Parley Pidcock

## 2014-05-07 NOTE — Telephone Encounter (Signed)
Spoke with patient's wife and she states that when he tries to wait for 5-6 hours before taking Sinemet that he will have rigidity and tremor come on very fast and it takes longer for them to go away.

## 2014-05-07 NOTE — Telephone Encounter (Signed)
Left message on machine for patient to call back. To find out what symptoms patient is having.

## 2014-05-07 NOTE — Telephone Encounter (Signed)
They will go up to 0.1 V on each side, but made appt for Wednesday if that doesn't help.

## 2014-05-07 NOTE — Telephone Encounter (Signed)
What sx's is he having?

## 2014-05-09 ENCOUNTER — Ambulatory Visit: Payer: 59 | Admitting: Neurology

## 2014-05-10 ENCOUNTER — Telehealth: Payer: Self-pay | Admitting: Neurology

## 2014-05-10 NOTE — Telephone Encounter (Signed)
Hunter Brown - please send no show letter to pt for 05/09/14 appt w/ Dr. Carles Collet / Venida Jarvis

## 2014-05-10 NOTE — Telephone Encounter (Signed)
Yes, if for no other reason to remind them to cancel

## 2014-05-10 NOTE — Telephone Encounter (Signed)
Pt no showed 05/09/14 DBS appt. Do you want a no show letter sent to the patient? Please advise-Sherri

## 2014-05-14 ENCOUNTER — Telehealth: Payer: Self-pay | Admitting: Neurology

## 2014-05-14 NOTE — Telephone Encounter (Signed)
Left message on machine for patient to call back.

## 2014-05-14 NOTE — Telephone Encounter (Signed)
Hunter Brown, Pt's spouse called wanting to discuss his DBN. Please call pt's wife # [phone]

## 2014-05-14 NOTE — Telephone Encounter (Signed)
Spoke with patient - he states the small increase last week didn't do much. Appt made for Wednesday for Dr Tat to look at device.

## 2014-05-15 ENCOUNTER — Encounter: Payer: Self-pay | Admitting: *Deleted

## 2014-05-15 NOTE — Progress Notes (Signed)
No show letter sent for 05/09/2014

## 2014-05-16 ENCOUNTER — Encounter: Payer: Self-pay | Admitting: Neurology

## 2014-05-16 ENCOUNTER — Ambulatory Visit (INDEPENDENT_AMBULATORY_CARE_PROVIDER_SITE_OTHER): Payer: 59 | Admitting: Neurology

## 2014-05-16 VITALS — BP 132/92 | HR 84 | Ht 78.0 in | Wt 192.0 lb

## 2014-05-16 DIAGNOSIS — F0280 Dementia in other diseases classified elsewhere without behavioral disturbance: Secondary | ICD-10-CM

## 2014-05-16 DIAGNOSIS — G20 Parkinson's disease: Secondary | ICD-10-CM

## 2014-05-16 NOTE — Patient Instructions (Signed)
Dr Glenna Fellows can be contacted at [phone].

## 2014-05-16 NOTE — Progress Notes (Signed)
Hunter Brown was seen today in the movement disorders clinic for neurologic consultation at the request of [facility], [facility] K, MD.  The consultation is for the evaluation of PD.  The patient is seen today in the movement disorder clinic at the request of Dr. Manuella Ghazi.  The patient has previously seen Dr. Linus Mako as well.  I reviewed notes from both of them.  Patient was diagnosed with Parkinson's disease in 55 at the age of 63 years old.  Patient's first symptom was mild tremor in the R hand.  Pt states that his biggest problem was rigidity and that was what made him pursue DBS.  The patient underwent bilateral STN DBS on 04/13/2003.  This was done without microelectrode recording.  His generator was last replaced in 2009.    The patient is currently on carbidopa/levodopa 25/100, 6 tablets per day.  He is also on Azilect and amantadine.  Patient is on Aricept for memory changes and Florinef 1 mg twice a day for orthostatic hypotension.  03/13/14 update:  The patient is following up today, accompanied by his wife who supplements the history.  Pt on carbidopa/levodopa 25/100 six times per day.  Is also on azilect and amantadine.  He had his battery changed on 02/26/14.  Immediately after his battery change, however, he apparently developed dyskinesia even on the low settings that he was previously on and they turned him all the way back to a voltage of only 1.3 (on the left side where he was previously at 2.2).  Patient only took his exam dosage of medication today to allow me to program him this afternoon.  He feels okay on the right-hand side of his body, but states that he feels stiff on the left.  He continues to have some difficulty with spontaneous eye closure.  They asked me about getting a prior authorization for Botox.  03/27/14 update:  The patient is following up today, accompanied by his wife who supplements the history.  Has done well until today, but that is only because hasn't had levodopa  since 4:30am today.  Has decreased his levodopa from 6 tablets per day to 5 tablets per day.  His wife asked me about potentially getting off of the Azilect, as he is going to hit the donut hole very early next year if he remains on it.  He has had no dyskinesia.  No falls.  Wife asks me about remaining in the amantadine and if he needs the medication.  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  I did discuss this with his primary neurologist since last visit, and the conclusion was that he would get this done here as opposed to [facility] neurology.  03/29/14 update:  Pt was seen unexpectedly today.  He is accompanied by his wife who supplements the history.  Apparently, before he then left the office last visit, he dropped his own stimulation to 2.1 volts bilaterally, but also took a levodopa at the same time and then became quite dyskinetic.  This has been the pattern over the last 2 days.  Every time he takes his levodopa, he he comes very dyskinetic.  It turns out that although I thought he was taking carbidopa/levodopa 25/100, he is actually taking carbidopa/levodopa 50/200 with each dosage.  He does not necessarily feel like he needs the dosage, but was taking it "when it was due."  He took his last dose today  at 11:50 AM and at 11:30 AM he was not dyskinetic, but by the time I saw him at 2 PM, he was very dyskinetic.  He has been taking about 5 of the carbidopa/levodopa 50/200 per day.  He is off of the Azilect.  04/18/14 update:  Pt is seen today, accompanied by wife who supplements the history.  Down to carbidopa/levodopa 25/100, four times per day.  Rarely takes 5 per day.  Last took carbidopa/levodopa 25/100 at 5 am and was seen at 10:45 am today.  Went back to 25/100 from the 50/200.  Still on amantadine 100 mg bid.  Very little dyskinesia.  Seems to take levodopa more further control of back pain than any other symptom.  For some  reason, he thinks that it helps his low back pain.  No falls since last visit.  No hallucinations.  His eyelids seem to be staying open better than in the past.  Has not yet been back to the dermatologist.  05/16/14 update:  The patient is seen in follow-up today, accompanied by his wife who supplements the history.  He is on carbidopa/levodopa 25/100, 4 tablets per day.  His biggest c/o continues to be back pain and states that he cannot focus on anything else besides that.  He has a derm appt in Jan.  Some tremor this AM but attributes that to back pain.  No dyskinesia.  He called here and went up 0.1 V and that seemed to help.    ALLERGIES:  No Known Allergies  CURRENT MEDICATIONS:  Outpatient Encounter Prescriptions as of 05/16/2014  Medication Sig  . ALPRAZolam (XANAX) 0.5 MG tablet Take 0.5 mg by mouth 3 (three) times daily.  Marland Kitchen amantadine (SYMMETREL) 100 MG capsule Take 100 mg by mouth 2 (two) times daily.   . baclofen (LIORESAL) 10 MG tablet Take 10 mg by mouth 3 (three) times daily.   . carbidopa-levodopa (SINEMET IR) 25-100 MG per tablet Take 1 tablet by mouth 5 (five) times daily.  Marland Kitchen docusate sodium (COLACE) 100 MG capsule Take 100 mg by mouth 2 (two) times daily.  Marland Kitchen donepezil (ARICEPT) 5 MG tablet Take 5 mg by mouth at bedtime.  Marland Kitchen etodolac (LODINE) 400 MG tablet Take 400 mg by mouth 2 (two) times daily.  . fludrocortisone (FLORINEF) 0.1 MG tablet Take 0.1 mg by mouth 2 (two) times daily.  . potassium chloride SA (K-DUR,KLOR-CON) 20 MEQ tablet Take 40 mEq by mouth daily.   . sildenafil (VIAGRA) 100 MG tablet Take 100 mg by mouth daily as needed for erectile dysfunction.    PAST MEDICAL HISTORY:   Past Medical History  Diagnosis Date  . Parkinson's disease   . Peripheral neuropathy   . Anxiety   . Scoliosis   . Osteoarthritis   . Nephrolithiasis   . Chest heaviness     started approximately one year ago 2014  . Shortness of breath     with exertion  . Elevated serum  creatinine   . Skin cancer of face     Face, nose  . Hypertension     PAST SURGICAL HISTORY:   Past Surgical History  Procedure Laterality Date  . Tonsillectomy    . Hemorroidectomy    . Burr hole w/ stereotactic insertion of dbs leads / intraop microelectrode recording    . Subthalamic stimulator battery replacement    . Back surgery      X 2  . Colonoscopy    . Upper gi endoscopy    .  Subthalamic stimulator battery replacement N/A 02/26/2014    Procedure: Deep Brain Stimulator battery replacement;  Surgeon: Erline Levine, MD;  Location: [facility];  Service: Neurosurgery;  Laterality: N/A;    SOCIAL HISTORY:   History   Social History  . Marital Status: Married    Spouse Name: N/A    Number of Children: N/A  . Years of Education: N/A   Occupational History  . Not on file.   Social History Main Topics  . Smoking status: Former Smoker    Quit date: 02/14/1974  . Smokeless tobacco: Not on file  . Alcohol Use: No  . Drug Use: No  . Sexual Activity: Not on file   Other Topics Concern  . Not on file   Social History Narrative    FAMILY HISTORY:   Family Status  Relation Status Death Age  . Mother Deceased     brain tumor  . Father Deceased     heart attack, dementia  . Brother Deceased     cancer  . Sister Deceased     diabetes  . Brother Alive     prostate cancer  . Son Alive     healthy  . Son Alive     healthy    ROS:  A complete 10 system review of systems was obtained and was unremarkable apart from what is mentioned above.  PHYSICAL EXAMINATION:    VITALS:   Filed Vitals:   05/16/14 1035  BP: 132/92  Pulse: 84  Height: 6\' 6"  (1.981 m)  Weight: 192 lb (87.091 kg)    GEN:  The patient appears stated age and is in NAD. HEENT:  Normocephalic, atraumatic.  The mucous membranes are moist. The superficial temporal arteries are without ropiness or tenderness.  Has skin lesion near cap of the R scalp electrode with scap and surrounding  erythematous rash.    Neurological examination:  Orientation: he is alert and oriented 3 today.  He is doing much better with his words today. Montreal Cognitive Assessment  02/14/2014  Visuospatial/ Executive (0/5) 2  Naming (0/3) 2  Attention: Read list of digits (0/2) 2  Attention: Read list of letters (0/1) 1  Attention: Serial 7 subtraction starting at 100 (0/3) 3  Language: Repeat phrase (0/2) 2  Language : Fluency (0/1) 1  Abstraction (0/2) 2  Delayed Recall (0/5) 0  Orientation (0/6) 5  Total 20  Adjusted Score (based on education) 20    Cranial nerves: There is good facial symmetry. . Extraocular muscles are intact.  No eyelid opening apraxia today.  The visual fields are full to confrontational testing. The speech is fluent and clear.    Soft palate rises symmetrically and there is no tongue deviation. Hearing is intact to conversational tone. Sensation: Sensation is intact to light touch throughout. Motor: Strength is 5/5 in the bilateral upper and lower extremities.   Shoulder shrug is equal and symmetric.  There is no pronator drift.   Movement examination: Tone: There is normal tone in the bilateral upper extremities.  The tone in the lower extremities is normal.  Abnormal movements: no dyskinesia.  Minor L hand tremor Coordination:  There is no decremation with RAM's today Gait and Station: The patient has some back pain with arising out of the chair, but once he is able to get up, stride length is good and arm swing is good.  DBS programming was performed today, which is described in more detail on a separate programming procedure note.  ASSESSMENT/PLAN:  1.  Idiopathic Parkinson's disease, diagnosed in 47.  -The patient is status post bilateral STN DBS in November, 2004, done without MER recording.  -The patients generator was last replaced on 02/26/14.  -the patient has been able to reduce his levodopa load from carbidopa/levodopa 50/200 5 times per day to  carbidopa/levodopa 25/100, 4 times per day.  I really think that he could probably reduce the levodopa further.  I think that he was having dopamine dysregulation syndrome, but I also think he has done well in trying to decrease it.  He is currently, however, using the levodopa for the treatment of back pain and I think that this is a bad idea.  I encouraged him to follow-up with Dr. Carloyn Manner and they were given the phone number to his office today.  2.  Eyelid opening apraxia/blepharospasm.  -This is associated with Parkinson's disease, and can be exacerbated by DBS therapy but he has actually done better with reprogramming and wants to hold on the Botox. 3.  Sialorrhea.  -We discussed the value of Myobloc.  He is not interested yet as not bad enough 4.  Parkinson's related memory changes.  -He is on Aricept.  I am hopeful that dropping medications can help as well. 5.  Scalp lesion near R DBS electrode  -He has an appointment with dermatology next month

## 2014-05-16 NOTE — Procedures (Signed)
DBS Programming was performed.    Total time spent programming was 30 minutes.  Device was confirmed to be on.  Soft start was confirmed to be on.  Impedences were checked and were within normal limits.  Battery was checked (3.02 V) and was determined to not be near the end of life.  Final settings were as follows:  Left brain electrode:     1-C+           ; Amplitude  2.5   V   ; Pulse width 60 microseconds;   Frequency   130   Hz. (transient lightheadedness)  Right brain electrode:     6-C+          ; Amplitude   2.1  V ;  Pulse width 60  microseconds;  Frequency   130    Hz. (had some paresthesias on the L with this electrode so did not push voltage up further)

## 2014-07-19 ENCOUNTER — Ambulatory Visit: Payer: 59 | Admitting: Neurology

## 2014-08-09 ENCOUNTER — Encounter: Payer: Self-pay | Admitting: Neurology

## 2014-08-09 ENCOUNTER — Ambulatory Visit (INDEPENDENT_AMBULATORY_CARE_PROVIDER_SITE_OTHER): Payer: PPO | Admitting: Neurology

## 2014-08-09 VITALS — BP 110/62 | HR 76 | Ht 78.0 in | Wt 198.0 lb

## 2014-08-09 DIAGNOSIS — C4441 Basal cell carcinoma of skin of scalp and neck: Secondary | ICD-10-CM

## 2014-08-09 DIAGNOSIS — F0280 Dementia in other diseases classified elsewhere without behavioral disturbance: Secondary | ICD-10-CM

## 2014-08-09 DIAGNOSIS — G20 Parkinson's disease: Secondary | ICD-10-CM

## 2014-08-09 DIAGNOSIS — M4120 Other idiopathic scoliosis, site unspecified: Secondary | ICD-10-CM

## 2014-08-09 MED ORDER — CARBIDOPA-LEVODOPA 25-100 MG PO TABS: 1.0000 | ORAL_TABLET | Freq: Four times a day (QID) | ORAL | Status: AC

## 2014-08-09 NOTE — Progress Notes (Signed)
Hunter Brown was seen today in the movement disorders clinic for neurologic consultation at the request of [facility], [facility] K, MD.  The consultation is for the evaluation of PD.  The patient is seen today in the movement disorder clinic at the request of Dr. Manuella Ghazi.  The patient has previously seen Dr. Linus Mako as well.  I reviewed notes from both of them.  Patient was diagnosed with Parkinson's disease in 4 at the age of 64 years old.  Patient's first symptom was mild tremor in the R hand.  Pt states that his biggest problem was rigidity and that was what made him pursue DBS.  The patient underwent bilateral STN DBS on 04/13/2003.  This was done without microelectrode recording.  His generator was last replaced in 2009.    The patient is currently on carbidopa/levodopa 25/100, 6 tablets per day.  He is also on Azilect and amantadine.  Patient is on Aricept for memory changes and Florinef 1 mg twice a day for orthostatic hypotension.  03/13/14 update:  The patient is following up today, accompanied by his wife who supplements the history.  Pt on carbidopa/levodopa 25/100 six times per day.  Is also on azilect and amantadine.  He had his battery changed on 02/26/14.  Immediately after his battery change, however, he apparently developed dyskinesia even on the low settings that he was previously on and they turned him all the way back to a voltage of only 1.3 (on the left side where he was previously at 2.2).  Patient only took his exam dosage of medication today to allow me to program him this afternoon.  He feels okay on the right-hand side of his body, but states that he feels stiff on the left.  He continues to have some difficulty with spontaneous eye closure.  They asked me about getting a prior authorization for Botox.  03/27/14 update:  The patient is following up today, accompanied by his wife who supplements the history.  Has done well until today, but that is only because hasn't had levodopa since  4:30am today.  Has decreased his levodopa from 6 tablets per day to 5 tablets per day.  His wife asked me about potentially getting off of the Azilect, as he is going to hit the donut hole very early next year if he remains on it.  He has had no dyskinesia.  No falls.  Wife asks me about remaining in the amantadine and if he needs the medication.  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  I did discuss this with his primary neurologist since last visit, and the conclusion was that he would get this done here as opposed to [facility] neurology.  03/29/14 update:  Pt was seen unexpectedly today.  He is accompanied by his wife who supplements the history.  Apparently, before he then left the office last visit, he dropped his own stimulation to 2.1 volts bilaterally, but also took a levodopa at the same time and then became quite dyskinetic.  This has been the pattern over the last 2 days.  Every time he takes his levodopa, he he comes very dyskinetic.  It turns out that although I thought he was taking carbidopa/levodopa 25/100, he is actually taking carbidopa/levodopa 50/200 with each dosage.  He does not necessarily feel like he needs the dosage, but was taking it "when it was due."  He took his last dose today  at 11:50 AM and at 11:30 AM he was not dyskinetic, but by the time I saw him at 2 PM, he was very dyskinetic.  He has been taking about 5 of the carbidopa/levodopa 50/200 per day.  He is off of the Azilect.  04/18/14 update:  Pt is seen today, accompanied by wife who supplements the history.  Down to carbidopa/levodopa 25/100, four times per day.  Rarely takes 5 per day.  Last took carbidopa/levodopa 25/100 at 5 am and was seen at 10:45 am today.  Went back to 25/100 from the 50/200.  Still on amantadine 100 mg bid.  Very little dyskinesia.  Seems to take levodopa more further control of back pain than any other symptom.  For some reason, he  thinks that it helps his low back pain.  No falls since last visit.  No hallucinations.  His eyelids seem to be staying open better than in the past.  Has not yet been back to the dermatologist.  05/16/14 update:  The patient is seen in follow-up today, accompanied by his wife who supplements the history.  He is on carbidopa/levodopa 25/100, 4 tablets per day.  His biggest c/o continues to be back pain and states that he cannot focus on anything else besides that.  He has a derm appt in Jan.  Some tremor this AM but attributes that to back pain.  No dyskinesia.  He called here and went up 0.1 V and that seemed to help.  08/09/14 update:  The patient is following up, accompanied by his wife who supplements the history.  His is currently on carbidopa/levodopa 25/100, 1 tablet 3-4 times per day.  He has seen Dr. Carloyn Manner since our last visit. He was referred to Dr. Prince Rome at [facility] because his scoliosis was so bad.  He was scheduled to have back surgery but the day before the back surgery his insurance denied it.  This is undergoing appeal.  He has been to the dermatologist since our last visit.  It was a basal cell CA.  He was given meds to put on it as they didn't want to cut it out right near the electrode.  However, the area of irritation has grown significantly.  The cream they are putting on it they were told was a chemotherapy med.      ALLERGIES:  No Known Allergies  CURRENT MEDICATIONS:  Outpatient Encounter Prescriptions as of 08/09/2014  Medication Sig  . ALPRAZolam (XANAX) 0.5 MG tablet Take 0.5 mg by mouth 3 (three) times daily.  Marland Kitchen amantadine (SYMMETREL) 100 MG capsule Take 100 mg by mouth 2 (two) times daily.   . baclofen (LIORESAL) 10 MG tablet Take 10 mg by mouth 3 (three) times daily.   . carbidopa-levodopa (SINEMET IR) 25-100 MG per tablet Take 1 tablet by mouth 5 (five) times daily. (Patient taking differently: Take 1 tablet by mouth 4 (four) times daily. )  . docusate sodium (COLACE) 100  MG capsule Take 100 mg by mouth 2 (two) times daily.  Marland Kitchen donepezil (ARICEPT) 5 MG tablet Take 5 mg by mouth at bedtime.  Marland Kitchen etodolac (LODINE) 400 MG tablet Take 400 mg by mouth 2 (two) times daily.  . fludrocortisone (FLORINEF) 0.1 MG tablet Take 0.1 mg by mouth 2 (two) times daily.  . potassium chloride SA (K-DUR,KLOR-CON) 20 MEQ tablet Take 40 mEq by mouth daily.   . sildenafil (VIAGRA) 100 MG tablet Take 100 mg by mouth daily as needed for erectile dysfunction.  PAST MEDICAL HISTORY:   Past Medical History  Diagnosis Date  . Parkinson's disease   . Peripheral neuropathy   . Anxiety   . Scoliosis   . Osteoarthritis   . Nephrolithiasis   . Chest heaviness     started approximately one year ago 2014  . Shortness of breath     with exertion  . Elevated serum creatinine   . Skin cancer of face     Face, nose  . Hypertension     PAST SURGICAL HISTORY:   Past Surgical History  Procedure Laterality Date  . Tonsillectomy    . Hemorroidectomy    . Burr hole w/ stereotactic insertion of dbs leads / intraop microelectrode recording    . Subthalamic stimulator battery replacement    . Back surgery      X 2  . Colonoscopy    . Upper gi endoscopy    . Subthalamic stimulator battery replacement N/A 02/26/2014    Procedure: Deep Brain Stimulator battery replacement;  Surgeon: Erline Levine, MD;  Location: [place] [facility];  Service: Neurosurgery;  Laterality: N/A;    SOCIAL HISTORY:   History   Social History  . Marital Status: Married    Spouse Name: N/A  . Number of Children: N/A  . Years of Education: N/A   Occupational History  . Not on file.   Social History Main Topics  . Smoking status: Former Smoker    Quit date: 02/14/1974  . Smokeless tobacco: Not on file  . Alcohol Use: No  . Drug Use: No  . Sexual Activity: Not on file   Other Topics Concern  . Not on file   Social History Narrative    FAMILY HISTORY:   Family Status  Relation Status Death Age  . Mother  Deceased     brain tumor  . Father Deceased     heart attack, dementia  . Brother Deceased     cancer  . Sister Deceased     diabetes  . Brother Alive     prostate cancer  . Son Alive     healthy  . Son Alive     healthy    ROS:  A complete 10 system review of systems was obtained and was unremarkable apart from what is mentioned above.  PHYSICAL EXAMINATION:    VITALS:   Filed Vitals:   08/09/14 0954  BP: 110/62  Pulse: 76  Height: 6\' 6"  (1.981 m)  Weight: 198 lb (89.812 kg)   Wt Readings from Last 3 Encounters:  08/09/14 198 lb (89.812 kg)  05/16/14 192 lb (87.091 kg)  04/18/14 189 lb (85.73 kg)    GEN:  The patient appears stated age and is in NAD. HEENT:  Normocephalic, atraumatic.  The mucous membranes are moist. The superficial temporal arteries are without ropiness or tenderness.   Skin:  Large lesion that has erythematous edges and crusted at center with edges approaching the right DBS electrode cap    Neurological examination:  Orientation: he is alert and oriented 3 today.  Montreal Cognitive Assessment  02/14/2014  Visuospatial/ Executive (0/5) 2  Naming (0/3) 2  Attention: Read list of digits (0/2) 2  Attention: Read list of letters (0/1) 1  Attention: Serial 7 subtraction starting at 100 (0/3) 3  Language: Repeat phrase (0/2) 2  Language : Fluency (0/1) 1  Abstraction (0/2) 2  Delayed Recall (0/5) 0  Orientation (0/6) 5  Total 20  Adjusted Score (based on education) 20  Cranial nerves: There is good facial symmetry. . Extraocular muscles are intact.  No eyelid opening apraxia today.  The visual fields are full to confrontational testing. The speech is fluent and clear.    Soft palate rises symmetrically and there is no tongue deviation. Hearing is intact to conversational tone. Sensation: Sensation is intact to light touch throughout. Motor: Strength is 5/5 in the bilateral upper and lower extremities.   Shoulder shrug is equal and symmetric.   There is no pronator drift.   Movement examination: Tone: There is normal tone in the bilateral upper extremities.  The tone in the lower extremities is normal.  Abnormal movements: no dyskinesia.  No tremor noted today Coordination:  There is no decremation with RAM's today Gait and Station: The patient easily gets out of the chair and walks with good stride length  DBS programming was performed today, which is described in more detail on a separate programming procedure note.    ASSESSMENT/PLAN:  1.  Idiopathic Parkinson's disease, diagnosed in 60.  -The patient is status post bilateral STN DBS in November, 2004, done without MER recording.  -The patients generator was last replaced on 02/26/14.  -the patient has been able to reduce his levodopa load from carbidopa/levodopa 50/200 5 times per day to carbidopa/levodopa 25/100, 3-4 times per day.    -DBS programming performed today and descibed in detail on separate programming procedural note 2.  Eyelid opening apraxia/blepharospasm.  -This is associated with Parkinson's disease, and can be exacerbated by DBS therapy but he has actually done better with reprogramming and wants to hold on the Botox. 3.  Sialorrhea.  -We discussed the value of Myobloc.  He is not interested yet as not bad enough 4.  Parkinson's related memory changes.  -He is on Aricept.   5. Basal cell near scalp electrode on right  -Has been treated with cream but looking very red and area spreading close to electrode.  Sent picture to Dr. Vertell Limber to review.  Don't think that electrode itself looks infected but will need to watch for spreading closer to it.   6.  Scoliosis  -severe.  Awaiting back surgery.  DBS will need to be turned off and discussed with patient and wife.

## 2014-08-09 NOTE — Procedures (Signed)
DBS Programming was performed.    Total time spent programming was 25 minutes.  Device was confirmed to be on.  Soft start was confirmed to be on.  Impedences were checked and were within normal limits.  Battery was checked (2.98 V) and was determined to not be near the end of life.  Final settings were as follows:  Left brain electrode:     1-C+           ; Amplitude  2.5 (pt came in on 2.4 even though last set on 2.5)   V   ; Pulse width 60 microseconds;   Frequency   140   Hz.   Right brain electrode:     6-C+          ; Amplitude   2.3  V ;  Pulse width 60  microseconds;  Frequency   140    Hz.

## 2014-08-28 ENCOUNTER — Ambulatory Visit: Payer: Self-pay | Admitting: Family Medicine

## 2014-09-03 ENCOUNTER — Telehealth: Payer: Self-pay | Admitting: Neurology

## 2014-09-03 NOTE — Telephone Encounter (Signed)
Is he still on amantadine?  If so, d/c and see if helps.  Any other new meds?

## 2014-09-03 NOTE — Telephone Encounter (Signed)
Spoke with patient's wife and he has not been on any new medication. He is still on Amantadine. They will stop medication and give [place] in an update in a week.

## 2014-09-03 NOTE — Telephone Encounter (Signed)
Correction, Linda's number is # [phone]

## 2014-09-03 NOTE — Telephone Encounter (Signed)
Please advise 

## 2014-09-03 NOTE — Telephone Encounter (Signed)
Hunter Brown, pt's wife called wanting to speak to a nurse regarding pt's condition. Pt is seeing things at night, Getting up and fully dressed at night for no reason. Pt is getting day and night confused. Pt tried to leave the house at [place].  Please call Hunter Brown # [phone]

## 2014-09-11 ENCOUNTER — Telehealth: Payer: Self-pay | Admitting: Neurology

## 2014-09-11 NOTE — Telephone Encounter (Signed)
Hunter Brown, pt's wife called wanting to speak to a nurse regarding pt's condition. She states that pt will sit in front of the TV for 3 hours and not  Turn on the TV, he will stare into space and not acting like himself.  Please call her # [phone]

## 2014-09-11 NOTE — Telephone Encounter (Signed)
Spoke with wife she states that since Sunday he has not spoke six works, he has just been out of sorts, very confused and non functional. Patient has been unable to use the bathroom or remember to take his medication as well both of which he had been doing independently before. Patient just sits in a chair and stares into space for hours. Please advise

## 2014-09-11 NOTE — Telephone Encounter (Signed)
I would have him get checked out at PCP ASAP to check UA, etc.  If no PCP availability, then go to UC/ER.  Doesn't sound like a primary parkinsons issue.

## 2014-09-12 NOTE — Telephone Encounter (Signed)
Patient's wife called back. States she was told to get UA yesterday. They had this done and it looks okay. They were not evaluated by a doctor. Advised per Dr Doristine Devoid recommendation that he needed evaluated by his PCP or an Urgent Care/ ER doctor. She will take him for evaluation.

## 2014-09-13 ENCOUNTER — Emergency Department
Admit: 2014-09-13 | Disposition: A | Discharge status: Other Acute Inpatient Hospital | Payer: Self-pay | Admitting: Internal Medicine

## 2014-09-13 ENCOUNTER — Inpatient Hospital Stay (HOSPITAL_COMMUNITY)
Admission: AD | Admit: 2014-09-13 | Discharge: 2014-09-15 | DRG: 948 | Disposition: A | Discharge status: Home / Self Care | Payer: PPO | Source: Other Acute Inpatient Hospital | Attending: Internal Medicine | Admitting: Internal Medicine

## 2014-09-13 ENCOUNTER — Telehealth: Payer: Self-pay | Admitting: Neurology

## 2014-09-13 DIAGNOSIS — Z9181 History of falling: Secondary | ICD-10-CM | POA: Diagnosis not present

## 2014-09-13 DIAGNOSIS — G20A1 Parkinson's disease without dyskinesia, without mention of fluctuations: Secondary | ICD-10-CM | POA: Diagnosis present

## 2014-09-13 DIAGNOSIS — I10 Essential (primary) hypertension: Secondary | ICD-10-CM | POA: Diagnosis present

## 2014-09-13 DIAGNOSIS — Z87891 Personal history of nicotine dependence: Secondary | ICD-10-CM

## 2014-09-13 DIAGNOSIS — R350 Frequency of micturition: Secondary | ICD-10-CM | POA: Diagnosis present

## 2014-09-13 DIAGNOSIS — G20 Parkinson's disease: Secondary | ICD-10-CM | POA: Diagnosis present

## 2014-09-13 DIAGNOSIS — N179 Acute kidney failure, unspecified: Secondary | ICD-10-CM | POA: Diagnosis present

## 2014-09-13 DIAGNOSIS — N189 Chronic kidney disease, unspecified: Secondary | ICD-10-CM | POA: Diagnosis present

## 2014-09-13 DIAGNOSIS — M1990 Unspecified osteoarthritis, unspecified site: Secondary | ICD-10-CM | POA: Diagnosis present

## 2014-09-13 DIAGNOSIS — R4182 Altered mental status, unspecified: Secondary | ICD-10-CM | POA: Diagnosis present

## 2014-09-13 DIAGNOSIS — R059 Cough, unspecified: Secondary | ICD-10-CM

## 2014-09-13 DIAGNOSIS — F0390 Unspecified dementia without behavioral disturbance: Secondary | ICD-10-CM | POA: Diagnosis present

## 2014-09-13 DIAGNOSIS — G629 Polyneuropathy, unspecified: Secondary | ICD-10-CM | POA: Diagnosis present

## 2014-09-13 DIAGNOSIS — M419 Scoliosis, unspecified: Secondary | ICD-10-CM | POA: Diagnosis present

## 2014-09-13 DIAGNOSIS — R05 Cough: Secondary | ICD-10-CM | POA: Diagnosis present

## 2014-09-13 DIAGNOSIS — F419 Anxiety disorder, unspecified: Secondary | ICD-10-CM | POA: Diagnosis present

## 2014-09-13 DIAGNOSIS — Z7982 Long term (current) use of aspirin: Secondary | ICD-10-CM

## 2014-09-13 DIAGNOSIS — I129 Hypertensive chronic kidney disease with stage 1 through stage 4 chronic kidney disease, or unspecified chronic kidney disease: Secondary | ICD-10-CM | POA: Diagnosis present

## 2014-09-13 DIAGNOSIS — Z79899 Other long term (current) drug therapy: Secondary | ICD-10-CM

## 2014-09-13 LAB — CBC
HCT: 39.7 % — ABNORMAL LOW
HGB: 13.2 g/dL
MCH: 29.7 pg
MCHC: 33.3 g/dL
MCV: 89 fL
Platelet: 158 x10 3/mm 3
RBC: 4.45 x10 6/mm 3
RDW: 13.7 %
WBC: 5.6 x10 3/mm 3

## 2014-09-13 LAB — COMPREHENSIVE METABOLIC PANEL
ANION GAP: 6 — AB (ref 7–16)
Albumin: 4.2 g/dL
Alkaline Phosphatase: 89 U/L
BILIRUBIN TOTAL: 0.7 mg/dL
BUN: 28 mg/dL — ABNORMAL HIGH
CO2: 32 mmol/L
Calcium, Total: 8.9 mg/dL
Chloride: 104 mmol/L
Creatinine: 1.67 mg/dL — ABNORMAL HIGH
EGFR (African American): 49 — ABNORMAL LOW
GFR CALC NON AF AMER: 42 — AB
Glucose: 108 mg/dL — ABNORMAL HIGH
POTASSIUM: 3.3 mmol/L — AB
SGOT(AST): 20 U/L
SGPT (ALT): 5 U/L — ABNORMAL LOW
Sodium: 142 mmol/L
Total Protein: 7.6 g/dL

## 2014-09-13 LAB — URINALYSIS, COMPLETE
Bilirubin,UR: NEGATIVE
Glucose,UR: NEGATIVE mg/dL (ref 0–75)
Leukocyte Esterase: NEGATIVE
NITRITE: NEGATIVE
PH: 5 (ref 4.5–8.0)
Protein: 30
Specific Gravity: 1.024 (ref 1.003–1.030)
WBC UR: NONE SEEN /HPF (ref 0–5)

## 2014-09-13 LAB — CK TOTAL AND CKMB (NOT AT ARMC)
CK, TOTAL: 87 U/L
CK-MB: 1.5 ng/mL

## 2014-09-13 LAB — TROPONIN I: Troponin-I: 0.04 ng/mL — ABNORMAL HIGH

## 2014-09-13 LAB — GLUCOSE, CAPILLARY: GLUCOSE-CAPILLARY: 142 mg/dL — AB (ref 70–99)

## 2014-09-13 MED ORDER — ALPRAZOLAM 0.5 MG PO TABS
0.5000 mg | ORAL_TABLET | Freq: Three times a day (TID) | ORAL | Status: DC
  Administered 2014-09-13 – 2014-09-15 (×6): 0.5 mg via ORAL
  Filled 2014-09-13 (×6): qty 1

## 2014-09-13 MED ORDER — HEPARIN SODIUM (PORCINE) 5000 UNIT/ML IJ SOLN
5000.0000 [IU] | Freq: Three times a day (TID) | INTRAMUSCULAR | Status: DC
  Administered 2014-09-13 – 2014-09-15 (×6): 5000 [IU] via SUBCUTANEOUS
  Filled 2014-09-13 (×6): qty 1

## 2014-09-13 MED ORDER — DONEPEZIL HCL 5 MG PO TABS
5.0000 mg | ORAL_TABLET | Freq: Every day | ORAL | Status: DC
  Filled 2014-09-13: qty 1

## 2014-09-13 MED ORDER — ACETAMINOPHEN 325 MG PO TABS: 650.0000 mg | ORAL_TABLET | Freq: Four times a day (QID) | ORAL | Status: DC | PRN

## 2014-09-13 MED ORDER — ONDANSETRON HCL 4 MG PO TABS: 4.0000 mg | ORAL_TABLET | Freq: Four times a day (QID) | ORAL | Status: DC | PRN

## 2014-09-13 MED ORDER — FLUDROCORTISONE ACETATE 0.1 MG PO TABS
0.1000 mg | ORAL_TABLET | Freq: Two times a day (BID) | ORAL | Status: DC
  Administered 2014-09-14 – 2014-09-15 (×4): 0.1 mg via ORAL
  Filled 2014-09-13 (×7): qty 1

## 2014-09-13 MED ORDER — SODIUM CHLORIDE 0.9 % IV SOLN
INTRAVENOUS | Status: DC
  Administered 2014-09-13: 1000 mL via INTRAVENOUS
  Administered 2014-09-14: 17:00:00 via INTRAVENOUS

## 2014-09-13 MED ORDER — POLYETHYLENE GLYCOL 3350 17 G PO PACK
17.0000 g | PACK | Freq: Every day | ORAL | Status: DC | PRN
  Filled 2014-09-13: qty 1

## 2014-09-13 MED ORDER — ONDANSETRON HCL 4 MG/2ML IJ SOLN: 4.0000 mg | Freq: Four times a day (QID) | INTRAMUSCULAR | Status: DC | PRN

## 2014-09-13 MED ORDER — DOCUSATE SODIUM 100 MG PO CAPS
100.0000 mg | ORAL_CAPSULE | Freq: Two times a day (BID) | ORAL | Status: DC
  Administered 2014-09-13 – 2014-09-15 (×4): 100 mg via ORAL
  Filled 2014-09-13 (×5): qty 1

## 2014-09-13 MED ORDER — RIVASTIGMINE 4.6 MG/24HR TD PT24
4.6000 mg | MEDICATED_PATCH | Freq: Every day | TRANSDERMAL | Status: DC
Start: 2014-09-14 — End: 2014-09-15
  Administered 2014-09-14 – 2014-09-15 (×2): 4.6 mg via TRANSDERMAL
  Filled 2014-09-13 (×2): qty 1

## 2014-09-13 MED ORDER — CARBIDOPA-LEVODOPA 25-100 MG PO TABS
1.0000 | ORAL_TABLET | Freq: Four times a day (QID) | ORAL | Status: DC
  Administered 2014-09-13 – 2014-09-15 (×8): 1 via ORAL
  Filled 2014-09-13 (×9): qty 1

## 2014-09-13 MED ORDER — ACETAMINOPHEN 650 MG RE SUPP: 650.0000 mg | Freq: Four times a day (QID) | RECTAL | Status: DC | PRN

## 2014-09-13 MED ORDER — FOLIC ACID 1 MG PO TABS
1.0000 mg | ORAL_TABLET | Freq: Every day | ORAL | Status: DC
  Administered 2014-09-13 – 2014-09-15 (×3): 1 mg via ORAL
  Filled 2014-09-13 (×3): qty 1

## 2014-09-13 MED ORDER — ADULT MULTIVITAMIN W/MINERALS CH
1.0000 | ORAL_TABLET | Freq: Every day | ORAL | Status: DC
  Administered 2014-09-13 – 2014-09-15 (×3): 1 via ORAL
  Filled 2014-09-13 (×3): qty 1

## 2014-09-13 MED ORDER — BACLOFEN 10 MG PO TABS
10.0000 mg | ORAL_TABLET | Freq: Three times a day (TID) | ORAL | Status: DC
  Administered 2014-09-14 – 2014-09-15 (×5): 10 mg via ORAL
  Filled 2014-09-13 (×7): qty 1

## 2014-09-13 MED ORDER — CARBIDOPA-LEVODOPA 25-100 MG PO TABS
1.0000 | ORAL_TABLET | Freq: Four times a day (QID) | ORAL | Status: DC
  Administered 2014-09-13: 1 via ORAL
  Filled 2014-09-13: qty 1

## 2014-09-13 MED ORDER — VITAMIN B-1 100 MG PO TABS
100.0000 mg | ORAL_TABLET | Freq: Every day | ORAL | Status: DC
  Administered 2014-09-13 – 2014-09-15 (×3): 100 mg via ORAL
  Filled 2014-09-13 (×3): qty 1

## 2014-09-13 MED ORDER — SODIUM CHLORIDE 0.9 % IJ SOLN
3.0000 mL | Freq: Two times a day (BID) | INTRAMUSCULAR | Status: DC
  Administered 2014-09-14: 3 mL via INTRAVENOUS

## 2014-09-13 NOTE — Progress Notes (Signed)
Called by Dr. Charlotte Sanes, [place] at [facility] for transfer to [facility] tele.  Pt with h/o parkinsons disease x25 years Followed by Dr. Carles Collet.  Has h/o Deep brain stimulator.  Has 3 day h/o worsening confusion, multiple falls, wandering and inappropriate behavior (urinating in hall).  Amantadine stopped recently.  CT brain without acute change. UA EKG unremarkable.  Has h/o CKD 3 and creatinine 1.67, which is baseline. Troponin minimally elevated at 0.04. EDP spoke to Drs. Tat and Camilo.  They recommend transfer to [facility] to hospitalists and neuro consult.  Will need serial troponins.  Call neuro hospitalist to consult   NURSING:  CALL FLOW MANAGER ON ARRIVAL TO UNIT.  SHE WILL PAGE HOSPITALIST FOR ORDERS.  [phone]  Doree Barthel, MD [facility]

## 2014-09-13 NOTE — H&P (Signed)
[facility] History and Physical  Alto Gandolfo MLJ:[id number] DOB: Feb 07, 1951 DOA: 09/13/2014  Referring physician: Jim Like, MD PCP: [facility], MD   Chief Complaint: Altered Mental Status  HPI: Hunter Brown is a 64 y.o. male presents with altered mental status. Patient was noted by his wife to be last in his usual state about 2 weeks ago. She states that over the last 2 weeks he has had a decline in his mental status. He has been confused and his wife states he sometimes stares off blankly. He has been going to the bathroom in the hallway. He was going from bed to bed and told his wife he was courting. He has had loss of urine control also. He has not been combative but sometimes he does get aggressive. He has no headaches noted. He has no fevers noted. He states there is no chest pain. He has no swelling in his legs. He has had frequent urination noted. He has had cough which is dry. He went to [facility] ED and was sent here as there is no neurology available. His wife states that he is doing a little better now. Patient does have parkinsons disease and is on therapy for this.   Review of Systems:  Complete 12 point ROS done limited due to patients mental state  Past Medical History  Diagnosis Date  . Parkinson's disease   . Peripheral neuropathy   . Anxiety   . Scoliosis   . Osteoarthritis   . Nephrolithiasis   . Chest heaviness     started approximately one year ago 2014  . Shortness of breath     with exertion  . Elevated serum creatinine   . Skin cancer of face     Face, nose  . Hypertension    Past Surgical History  Procedure Laterality Date  . Tonsillectomy    . Hemorroidectomy    . Burr hole w/ stereotactic insertion of dbs leads / intraop microelectrode recording    . Subthalamic stimulator battery replacement    . Back surgery      X 2  . Colonoscopy    . Upper gi endoscopy    . Subthalamic stimulator battery replacement N/A 02/26/2014   Procedure: Deep Brain Stimulator battery replacement;  Surgeon: Erline Levine, MD;  Location: [place] [facility];  Service: Neurosurgery;  Laterality: N/A;   Social History:  reports that he quit smoking about 40 years ago. He does not have any smokeless tobacco history on file. He reports that he does not drink alcohol or use illicit drugs.  No Known Allergies  No family history on file.   Prior to Admission medications   Medication Sig Start Date End Date Taking? Authorizing Provider  ALPRAZolam Duanne Moron) 0.5 MG tablet Take 0.5 mg by mouth 3 (three) times daily.    Historical Provider, MD  amantadine (SYMMETREL) 100 MG capsule Take 100 mg by mouth 2 (two) times daily.     Historical Provider, MD  baclofen (LIORESAL) 10 MG tablet Take 10 mg by mouth 3 (three) times daily.     Historical Provider, MD  carbidopa-levodopa (SINEMET IR) 25-100 MG per tablet Take 1 tablet by mouth 4 (four) times daily. 08/09/14   Eustace Quail Tat, DO  docusate sodium (COLACE) 100 MG capsule Take 100 mg by mouth 2 (two) times daily.    Historical Provider, MD  donepezil (ARICEPT) 5 MG tablet Take 5 mg by mouth at bedtime.    Historical Provider, MD  etodolac (LODINE) 400 MG tablet Take 400 mg by mouth 2 (two) times daily.    Historical Provider, MD  fludrocortisone (FLORINEF) 0.1 MG tablet Take 0.1 mg by mouth 2 (two) times daily.    Historical Provider, MD  potassium chloride SA (K-DUR,KLOR-CON) 20 MEQ tablet Take 40 mEq by mouth daily.     Historical Provider, MD  sildenafil (VIAGRA) 100 MG tablet Take 100 mg by mouth daily as needed for erectile dysfunction.    Historical Provider, MD   Physical Exam: Filed Vitals:   09/13/14 1854  BP: 167/86  Pulse: 82  Temp: 97.5 F (36.4 C)  TempSrc: Oral  Resp: 18  Height: 6\' 6"  (1.981 m)  Weight: 88.7 kg (195 lb 8.8 oz)  SpO2: 100%    Wt Readings from Last 3 Encounters:  09/13/14 88.7 kg (195 lb 8.8 oz)  08/09/14 89.812 kg (198 lb)  05/16/14 87.091 kg (192 lb)     General:  Appears calm comfortable Eyes: PERRL, normal lids, irises & conjunctiva ENT: grossly normal hearing, lips & tongue Neck: no LAD, masses or thyromegaly Cardiovascular: RRR, no m/r/g. No LE edema. Respiratory: CTA bilaterally, no w/r/r. Normal respiratory effort. Abdomen: soft, ntnd Skin: no rash or induration seen on limited exam Musculoskeletal: grossly normal tone Psychiatric: grossly abnormal mood and affect Neurologic: grossly non-focal but he is significantly confused          Labs on Admission:  Basic Metabolic Panel: No results for input(s): NA, K, CL, CO2, GLUCOSE, BUN, CREATININE, CALCIUM, MG, PHOS in the last 168 hours. Liver Function Tests: No results for input(s): AST, ALT, ALKPHOS, BILITOT, PROT, ALBUMIN in the last 168 hours. No results for input(s): LIPASE, AMYLASE in the last 168 hours. No results for input(s): AMMONIA in the last 168 hours. CBC: No results for input(s): WBC, NEUTROABS, HGB, HCT, MCV, PLT in the last 168 hours. Cardiac Enzymes: No results for input(s): CKTOTAL, CKMB, CKMBINDEX, TROPONINI in the last 168 hours.  BNP (last 3 results) No results for input(s): BNP in the last 8760 hours.  ProBNP (last 3 results) No results for input(s): PROBNP in the last 8760 hours.  CBG: No results for input(s): GLUCAP in the last 168 hours.  Radiological Exams on Admission: No results found.    Assessment/Plan Principal Problem:   Altered mental status Active Problems:   Parkinson's disease   Essential hypertension   AKI (acute kidney injury)   1. Altered Mental Status -new onset worsening but has been a gradual decline over years -cannot have MRI will discuss with neurology -will get B12  Levels -check TSH -Will also get RPR and FTA -may need EEG -may need Spinal Tap  2. Parkinsons Disease -will continue with home medications -follow neuro consult recs  3. Essential hypertension -currently not on any medications -will  monitor blood pressures  4. AKI -his wife he does have some renal issues being investigated as an outpatient   Code Status: Full Code (must indicate code status--if unknown or must be presumed, indicate so) DVT Prophylaxis:[place] Family Communication: Wife (indicate person spoken with, if applicable, with phone number if by telephone) Disposition Plan: Home (indicate anticipated LOS)  Time spent: 47min  KHAN,SAADAT A [facility] Pager [phone]

## 2014-09-13 NOTE — Telephone Encounter (Signed)
Received call from [place] Dr. Maryanna Shape.  She said that pt is in their ER.  Is confused, urinating in inappropriate places.  Work up negative.  Wanted to transfer to [facility] and asked me about having seen by neurohospitalist.  Explained that I am out-pt physician and that he would need to be accepted by hospitalist as neuro-hospitalists don't do primary admits.  Pt has a DBS device that likely is not MRI compatible under new MRI standards as his DBS device is old.  Can get limited brain MRI under old protocol with low SAR

## 2014-09-13 NOTE — Consult Note (Signed)
Admission H&P    Chief Complaint: Mental status changes with increasing confusion.  HPI: Hunter Brown is an 64 y.o. male with Parkinson's disease, neuropathy, osteoarthritis hypertension, transferred from [facility] for management of her simply worsening mental status changes with increased confusion and inappropriate behavior. She was diagnosed with Parkinson's disease and 1994. He began showing mental status changes about 3 months ago with increasing memory difficulty and losing his train of thought. He was on Aricept 5 mg per day at that time which was discontinued. His wife noted no changes in his mental status after Aricept was stopped. Over the past several weeks he has been showing appropriate behavior with urinating in inappropriate places as well as day and night mixed up and attempting to go out of his house at night. The past few days his wife states that he has been sitting and staring with very minimal speech output. Symmetrel was discontinued about 10 days ago. Behavioral changes started prior to discontinuing Symmetrel, however. He has deep ring stimulator implanted, which was revised in September 2015. He is currently taking Sinemet 25-100 one tablet 4 times a day. No change in patients mild resting tremor has been noted on Symmetrel was discontinued. He is currently on no medication for memory difficulty. No change in speech or swallowing his been noted. His wife is also noticed no change in his gait or muscle tone.   Past Medical History  Diagnosis Date  . Parkinson's disease   . Peripheral neuropathy   . Anxiety   . Scoliosis   . Osteoarthritis   . Nephrolithiasis   . Chest heaviness     started approximately one year ago 2014  . Shortness of breath     with exertion  . Elevated serum creatinine   . Skin cancer of face     Face, nose  . Hypertension     Past Surgical History  Procedure Laterality Date  . Tonsillectomy    . Hemorroidectomy    . Burr hole w/ stereotactic  insertion of dbs leads / intraop microelectrode recording    . Subthalamic stimulator battery replacement    . Back surgery      X 2  . Colonoscopy    . Upper gi endoscopy    . Subthalamic stimulator battery replacement N/A 02/26/2014    Procedure: Deep Brain Stimulator battery replacement;  Surgeon: Erline Levine, MD;  Location: [place] [facility];  Service: Neurosurgery;  Laterality: N/A;   Family history: History is negative for concerns disease and dementia.  Social History:  reports that he quit smoking about 40 years ago. He does not have any smokeless tobacco history on file. He reports that he does not drink alcohol or use illicit drugs.  Allergies: No Known Allergies  Medications Prior to Admission  Medication Sig Dispense Refill  . ALPRAZolam (XANAX) 0.5 MG tablet Take 0.5 mg by mouth 3 (three) times daily.    . baclofen (LIORESAL) 10 MG tablet Take 10 mg by mouth 3 (three) times daily.     . carbidopa-levodopa (SINEMET IR) 25-100 MG per tablet Take 1 tablet by mouth 4 (four) times daily. 360 tablet 1  . donepezil (ARICEPT) 5 MG tablet Take 5 mg by mouth daily at 12 noon.     . etodolac (LODINE) 400 MG tablet Take 400 mg by mouth 2 (two) times daily.    . fludrocortisone (FLORINEF) 0.1 MG tablet Take 0.1 mg by mouth 2 (two) times daily.    . potassium chloride  SA (K-DUR,KLOR-CON) 20 MEQ tablet Take 40 mEq by mouth daily.     . sildenafil (VIAGRA) 100 MG tablet Take 100 mg by mouth daily as needed for erectile dysfunction.    . docusate sodium (COLACE) 100 MG capsule Take 100 mg by mouth 2 (two) times daily.      ROS: History obtained from spouse  General ROS: negative for - chills, fatigue, fever, night sweats, weight gain or weight loss Psychological ROS: As noted in present illness Ophthalmic ROS: negative for - blurry vision, double vision, eye pain or loss of vision ENT ROS: negative for - epistaxis, nasal discharge, oral lesions, sore throat, tinnitus or vertigo Allergy and  Immunology ROS: negative for - hives or itchy/watery eyes Hematological and Lymphatic ROS: negative for - bleeding problems, bruising or swollen lymph nodes Endocrine ROS: negative for - galactorrhea, hair pattern changes, polydipsia/polyuria or temperature intolerance Respiratory ROS: negative for - cough, hemoptysis, shortness of breath or wheezing Cardiovascular ROS: negative for - chest pain, dyspnea on exertion, edema or irregular heartbeat Gastrointestinal ROS: negative for - abdominal pain, diarrhea, hematemesis, nausea/vomiting or stool incontinence Genito-Urinary ROS: negative for - dysuria, hematuria, incontinence or urinary frequency/urgency Musculoskeletal ROS: negative for - joint swelling or muscular weakness Neurological ROS: as noted in HPI Dermatological ROS: negative for rash and skin lesion changes  Physical Examination: Blood pressure 167/86, pulse 82, temperature 97.5 F (36.4 C), temperature source Oral, resp. rate 18, height 6\' 6"  (1.981 m), weight 88.7 kg (195 lb 8.8 oz), SpO2 100 %.  HEENT-  Normocephalic, no lesions, without obvious abnormality.  Normal external eye and conjunctiva.  Normal TM's bilaterally.  Normal auditory canals and external ears. Normal external nose, mucus membranes and septum.  Normal pharynx. Neck supple with no masses, nodes, nodules or enlargement. Cardiovascular - regular rate and rhythm, S1, S2 normal, no murmur, click, rub or gallop Lungs - chest clear, no wheezing, rales, normal symmetric air entry Abdomen - soft, non-tender; bowel sounds normal; no masses,  no organomegaly Extremities - no joint deformities, effusion, or inflammation, no edema and no skin discoloration  Neurologic Examination: Mental Status: Alert, oriented to correct age and current month, but disoriented to place.  Speech fluent without evidence of aphasia. Able to follow commands without difficulty. Cranial Nerves: II-Visual fields were normal. III/IV/right pupil  slightly greater than left in size and both reacted normally to light. Extraocular movements were full and conjugate.    V/VII-no facial numbness and no facial weakness. VIII-normal. X-normal speech and symmetrical palatal movement. XI: trapezius strength/neck flexion strength normal bilaterally XII-midline tongue extension with normal strength. Motor: 5/5 strength throughout; slight increase in muscle tone throughout; slight resting tremor of upper extremities bilaterally, right greater than left Sensory: Normal throughout. Deep Tendon Reflexes: 1+ and symmetric and absent in lower extremities. Plantars: Extensor on the right and mute on the left. Cerebellar: Normal finger-to-nose testing. Carotid auscultation: Normal  No results found for this or any previous visit (from the past 48 hour(s)). No results found.  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 History and clinical findings do not indicate a progression of manifestations of Parkinson's disease with no worsening of tremor and no worsening of his gait as well as no increase in stiffness. Speech and swallowing have remained unchanged.  Recommendations: 1. MRI of the brain without contrast if feasible, given the presence of his deep brain stimulator.  2. CT scan of the head. If MRI cannot be obtained. 3. No change in current dose of Sinemet 25-100. 4. Recommend a trial of Exelon patch 4.6 mg per day as starting dose. 5. Physical therapy consult for gait evaluation and recommendations.  We will continue to follow this patient with you.  C.R. Nicole Kindred, [place] [facility] [phone]  09/13/2014, 9:39 PM

## 2014-09-14 ENCOUNTER — Inpatient Hospital Stay (HOSPITAL_COMMUNITY): Payer: PPO

## 2014-09-14 LAB — COMPREHENSIVE METABOLIC PANEL
ALT: 8 U/L (ref 0–53)
AST: 17 U/L (ref 0–37)
Albumin: 3.6 g/dL (ref 3.5–5.2)
Alkaline Phosphatase: 83 U/L (ref 39–117)
Anion gap: 12 (ref 5–15)
BUN: 20 mg/dL (ref 6–23)
CALCIUM: 8.9 mg/dL (ref 8.4–10.5)
CO2: 28 mmol/L (ref 19–32)
Chloride: 103 mmol/L (ref 96–112)
Creatinine, Ser: 1.41 mg/dL — ABNORMAL HIGH (ref 0.50–1.35)
GFR calc Af Amer: 59 mL/min — ABNORMAL LOW (ref >90)
GFR calc non Af Amer: 51 mL/min — ABNORMAL LOW (ref >90)
Glucose, Bld: 92 mg/dL (ref 70–99)
POTASSIUM: 3 mmol/L — AB (ref 3.5–5.1)
Sodium: 143 mmol/L (ref 135–145)
TOTAL PROTEIN: 7 g/dL (ref 6.0–8.3)
Total Bilirubin: 0.9 mg/dL (ref 0.3–1.2)

## 2014-09-14 LAB — CBC
HCT: 39 % (ref 39.0–52.0)
HEMATOCRIT: 36.5 % — AB (ref 39.0–52.0)
Hemoglobin: 11.9 g/dL — ABNORMAL LOW (ref 13.0–17.0)
Hemoglobin: 12.7 g/dL — ABNORMAL LOW (ref 13.0–17.0)
MCH: 29.3 pg (ref 26.0–34.0)
MCH: 29.2 pg (ref 26.0–34.0)
MCHC: 32.6 g/dL (ref 30.0–36.0)
MCHC: 32.6 g/dL (ref 30.0–36.0)
MCV: 89.9 fL (ref 78.0–100.0)
MCV: 89.5 fL (ref 78.0–100.0)
PLATELETS: 147 10*3/uL — AB (ref 150–400)
Platelets: 154 10*3/uL (ref 150–400)
RBC: 4.08 MIL/uL — ABNORMAL LOW (ref 4.22–5.81)
RBC: 4.34 MIL/uL (ref 4.22–5.81)
RDW: 13.2 % (ref 11.5–15.5)
RDW: 13.3 % (ref 11.5–15.5)
WBC: 4.4 10*3/uL (ref 4.0–10.5)
WBC: 4.6 10*3/uL (ref 4.0–10.5)

## 2014-09-14 LAB — HIV ANTIBODY (ROUTINE TESTING W REFLEX): HIV Screen 4th Generation wRfx: NONREACTIVE

## 2014-09-14 LAB — URINALYSIS, ROUTINE W REFLEX MICROSCOPIC
Bilirubin Urine: NEGATIVE
Glucose, UA: NEGATIVE mg/dL
Hgb urine dipstick: NEGATIVE
Ketones, ur: NEGATIVE mg/dL
LEUKOCYTES UA: NEGATIVE
NITRITE: NEGATIVE
PH: 7 (ref 5.0–8.0)
Protein, ur: NEGATIVE mg/dL
Specific Gravity, Urine: 1.014 (ref 1.005–1.030)
UROBILINOGEN UA: 0.2 mg/dL (ref 0.0–1.0)

## 2014-09-14 LAB — RPR: RPR Ser Ql: NONREACTIVE

## 2014-09-14 LAB — TROPONIN I
Troponin I: 0.03 ng/mL (ref <0.031)
Troponin I: 0.03 ng/mL (ref <0.031)
Troponin I: 0.03 ng/mL (ref <0.031)

## 2014-09-14 LAB — TSH
TSH: 1.953 u[IU]/mL (ref 0.350–4.500)
TSH: 2.004 u[IU]/mL (ref 0.350–4.500)

## 2014-09-14 LAB — CREATININE, SERUM
CREATININE: 1.47 mg/dL — AB (ref 0.50–1.35)
GFR, EST AFRICAN AMERICAN: 56 mL/min — AB (ref >90)
GFR, EST NON AFRICAN AMERICAN: 48 mL/min — AB (ref >90)

## 2014-09-14 LAB — MAGNESIUM: MAGNESIUM: 1.9 mg/dL (ref 1.5–2.5)

## 2014-09-14 LAB — VITAMIN B12: Vitamin B-12: 372 pg/mL (ref 211–911)

## 2014-09-14 MED ORDER — AMLODIPINE BESYLATE 5 MG PO TABS
5.0000 mg | ORAL_TABLET | Freq: Every day | ORAL | Status: DC
  Administered 2014-09-14 – 2014-09-15 (×2): 5 mg via ORAL
  Filled 2014-09-14 (×2): qty 1

## 2014-09-14 MED ORDER — POTASSIUM CHLORIDE CRYS ER 20 MEQ PO TBCR
40.0000 meq | EXTENDED_RELEASE_TABLET | ORAL | Status: AC
Start: 2014-09-14 — End: 2014-09-14
  Administered 2014-09-14 (×2): 40 meq via ORAL
  Filled 2014-09-14 (×2): qty 2

## 2014-09-14 MED ORDER — FLUTICASONE PROPIONATE 50 MCG/ACT NA SUSP
2.0000 | Freq: Every day | NASAL | Status: DC
  Administered 2014-09-14: 2 via NASAL
  Filled 2014-09-14: qty 16

## 2014-09-14 MED ORDER — ASPIRIN EC 81 MG PO TBEC
81.0000 mg | DELAYED_RELEASE_TABLET | Freq: Every day | ORAL | Status: DC
  Administered 2014-09-14 – 2014-09-15 (×2): 81 mg via ORAL
  Filled 2014-09-14 (×3): qty 1

## 2014-09-14 NOTE — Progress Notes (Signed)
SLP Cancellation Note  Patient Details Name: Hunter Brown MRN: [id number] DOB: [birth year]   Cancelled treatment:       Reason Eval/Treat Not Completed: Patient at procedure or test/unavailable   Temprance Wyre, Katherene Ponto 09/14/2014, 2:26 PM

## 2014-09-14 NOTE — Progress Notes (Addendum)
PROGRESS NOTE  Hunter Brown YHC:[id number] DOB: May 04, 1951 DOA: 09/13/2014 PCP: Vladimir Crofts, MD  HPI/Recap of past 24 hours:  Still confused, but attempt to follow commend, wife at bedside Noticed occasional dry cough, wife reported she notice the cough for about 1week. Not sure if this related to seasonal allergy.  Assessment/Plan: Principal Problem:   Altered mental status Active Problems:   Parkinson's disease   Essential hypertension   AKI (acute kidney injury)  Confusion:  UA/b12/tsh/rpr/hiv unremarkable. Talked to neurology Dr. Carles Collet over the phone, Dr. Carles Collet reported that patient was referred to her about 85months ago due to increased confusion, her goal is trying to use his DBS more and taper off meds. Dr. Carles Collet reported patient have been on aricept prior to being referred to her, and no change of aricept by her. Dr tat also reported patient's wife called on 4/4 due to increased confusion, and Dr tat instructed the wife that to stop amantadine due to side effect of confusion.  Dr .tat also reported that patient had a basal cell on his scalp that was removed recently. He also received topical 5-fu for that. Dr Tat also reported she has noticed patient have been having chronic word finding troubles and at baseline has trouble following motor commend  Radiologist called to report limited mri showed questionable Tiny lacunar infarct x1, but does has numerous chronic micro hemorrhages, no acute bleed, differential including long standing uncontrolled HTN vs angio amyloid.  Check carotic us/ lipid panel/echo  Neurology consulted,  Follow neuro rec's.  Htn: not on bp meds previously, start norvasc for now.  H/o parkinson's: s/p DBS, continue home meds.  Cough, crx unremarkable, lung exam benign, start flonase, does report postnasal drip, not sure if reliable.  Elevation of cr, possible baseline, from long term uncontrolled HTN, ua unremarkable.   Code Status: full  Family  Communication: patient and wife  Disposition Plan: remain inpatient   Consultants:  neurology  Procedures:  Modified MRI ( medtronic tech assisted during procedure, due to history of DBS)  Antibiotics:  none   Objective: BP 160/88 mmHg  Pulse 78  Temp(Src) 97.7 F (36.5 C) (Oral)  Resp 18  Ht 6\' 6"  (1.981 m)  Wt 88.7 kg (195 lb 8.8 oz)  BMI 22.60 kg/m2  SpO2 98%  Intake/Output Summary (Last 24 hours) at 09/14/14 1441 Last data filed at 09/14/14 0900  Gross per 24 hour  Intake 1102.5 ml  Output    400 ml  Net  702.5 ml   Filed Weights   09/13/14 1854 09/13/14 2238  Weight: 88.7 kg (195 lb 8.8 oz) 88.7 kg (195 lb 8.8 oz)    Exam:   General:  NAD but confused  Cardiovascular: RRR  Respiratory: CTABL  Abdomen: Soft/ND/NT, positive BS  Musculoskeletal: No Edema  Neuro: only oriented to person, not following commend consistently, difficult to discern focal deficit. Does seem to have word finding difficulties. Not able to perform immediate recall.  Data Reviewed: Basic Metabolic Panel:  Recent Labs Lab 09/14/14 09/14/14 0522 09/14/14 1130  NA  --  143  --   K  --  3.0*  --   CL  --  103  --   CO2  --  28  --   GLUCOSE  --  92  --   BUN  --  20  --   CREATININE 1.47* 1.41*  --   CALCIUM  --  8.9  --   MG  --   --  1.9   Liver Function Tests:  Recent Labs Lab 09/14/14 0522  AST 17  ALT 8  ALKPHOS 83  BILITOT 0.9  PROT 7.0  ALBUMIN 3.6   No results for input(s): LIPASE, AMYLASE in the last 168 hours. No results for input(s): AMMONIA in the last 168 hours. CBC:  Recent Labs Lab 09/14/14 09/14/14 0522  WBC 4.6 4.4  HGB 11.9* 12.7*  HCT 36.5* 39.0  MCV 89.5 89.9  PLT 147* 154   Cardiac Enzymes:    Recent Labs Lab 09/14/14 09/14/14 0522 09/14/14 1130  TROPONINI 0.03 0.03 0.03   BNP (last 3 results) No results for input(s): BNP in the last 8760 hours.  ProBNP (last 3 results) No results for input(s): PROBNP in the last  8760 hours.  CBG:  Recent Labs Lab 09/13/14 2256  GLUCAP 142*    No results found for this or any previous visit (from the past 240 hour(s)).   Studies: No results found.  Scheduled Meds: . ALPRAZolam  0.5 mg Oral TID  . baclofen  10 mg Oral TID  . carbidopa-levodopa  1 tablet Oral Q6H  . docusate sodium  100 mg Oral BID  . fludrocortisone  0.1 mg Oral BID WC  . folic acid  1 mg Oral Daily  . heparin  5,000 Units Subcutaneous 3 times per day  . multivitamin with minerals  1 tablet Oral Daily  . potassium chloride  40 mEq Oral Q4H  . rivastigmine  4.6 mg Transdermal Daily  . sodium chloride  3 mL Intravenous Q12H  . thiamine  100 mg Oral Daily    Continuous Infusions: . sodium chloride 1,000 mL (09/13/14 2245)     Time spent: >67mins  Elmer Boutelle MD, PhD  [facility] Pager [phone]. If 7PM-7AM, please contact night-coverage at [email or web address], password [facility] 09/14/2014, 2:41 PM  LOS: 1 day

## 2014-09-14 NOTE — Progress Notes (Signed)
EEG completed; results pending.    

## 2014-09-14 NOTE — Procedures (Signed)
EEG report.  Brief clinical history: 64 year old man with PD as well as what appears to be progressive dementia with newly exhibited changes, in addition to increasing memory difficulty.    Technique: this is a 17 channel routine scalp EEG performed at the bedside with bipolar and monopolar montages arranged in accordance to the international 10/20 system of electrode placement. One channel was dedicated to EKG recording.  The study was performed during wakefulness, drowsiness, and stage 2 sleep. No activating procedures performed.  Description:In the wakeful state, the best background consisted of a medium amplitude, posterior dominant, poorly sustained, symmetric and reactive 7-7.5 Hz rhythm. No focal or generalized epileptiform discharges noted.  No pathologic areas of slowing seen.   Impression: this is a mildy abnormal awake EEG due to he presence of a slow background. The findings could be indicative of a mild non specific encephalopathy that in this particular scenario could be related to an early degenerative dementing illness.  Clinical correlation is advised.   Dorian Pod, MD

## 2014-09-14 NOTE — Progress Notes (Signed)
Patient pulled out IV, more confused and agitated. Son assisting at [place] increased activity. Will notify MD of patient increased  Level of agitation

## 2014-09-15 DIAGNOSIS — I10 Essential (primary) hypertension: Secondary | ICD-10-CM

## 2014-09-15 DIAGNOSIS — R4182 Altered mental status, unspecified: Secondary | ICD-10-CM | POA: Insufficient documentation

## 2014-09-15 LAB — CBC
HCT: 38.8 % — ABNORMAL LOW (ref 39.0–52.0)
HEMOGLOBIN: 12.8 g/dL — AB (ref 13.0–17.0)
MCH: 29.9 pg (ref 26.0–34.0)
MCHC: 33 g/dL (ref 30.0–36.0)
MCV: 90.7 fL (ref 78.0–100.0)
Platelets: 150 10*3/uL (ref 150–400)
RBC: 4.28 MIL/uL (ref 4.22–5.81)
RDW: 13.3 % (ref 11.5–15.5)
WBC: 4.9 10*3/uL (ref 4.0–10.5)

## 2014-09-15 LAB — HEMOGLOBIN A1C
HEMOGLOBIN A1C: 5.7 % — AB (ref 4.8–5.6)
Mean Plasma Glucose: 117 mg/dL

## 2014-09-15 LAB — LIPID PANEL
CHOL/HDL RATIO: 2.8 ratio
CHOLESTEROL: 151 mg/dL (ref 0–200)
HDL: 54 mg/dL (ref >39)
LDL Cholesterol: 86 mg/dL (ref 0–99)
Triglycerides: 57 mg/dL (ref <150)
VLDL: 11 mg/dL (ref 0–40)

## 2014-09-15 LAB — BASIC METABOLIC PANEL
ANION GAP: 14 (ref 5–15)
BUN: 19 mg/dL (ref 6–23)
CO2: 25 mmol/L (ref 19–32)
Calcium: 8.8 mg/dL (ref 8.4–10.5)
Chloride: 104 mmol/L (ref 96–112)
Creatinine, Ser: 1.36 mg/dL — ABNORMAL HIGH (ref 0.50–1.35)
GFR calc non Af Amer: 53 mL/min — ABNORMAL LOW (ref >90)
GFR, EST AFRICAN AMERICAN: 61 mL/min — AB (ref >90)
GLUCOSE: 99 mg/dL (ref 70–99)
POTASSIUM: 3.6 mmol/L (ref 3.5–5.1)
SODIUM: 143 mmol/L (ref 135–145)

## 2014-09-15 LAB — GLUCOSE, CAPILLARY: GLUCOSE-CAPILLARY: 146 mg/dL — AB (ref 70–99)

## 2014-09-15 LAB — MAGNESIUM: MAGNESIUM: 1.8 mg/dL (ref 1.5–2.5)

## 2014-09-15 MED ORDER — RIVASTIGMINE 4.6 MG/24HR TD PT24: 4.6000 mg | MEDICATED_PATCH | Freq: Every day | TRANSDERMAL | Status: DC

## 2014-09-15 MED ORDER — DONEPEZIL HCL 5 MG PO TABS: 5.0000 mg | ORAL_TABLET | Freq: Every day | ORAL | Status: DC

## 2014-09-15 MED ORDER — ETODOLAC 400 MG PO TABS: 400.0000 mg | ORAL_TABLET | Freq: Every day | ORAL | Status: AC | PRN

## 2014-09-15 MED ORDER — CARVEDILOL 6.25 MG PO TABS
6.2500 mg | ORAL_TABLET | Freq: Two times a day (BID) | ORAL | Status: DC
  Administered 2014-09-15 (×2): 6.25 mg via ORAL
  Filled 2014-09-15 (×2): qty 1

## 2014-09-15 MED ORDER — CARVEDILOL 6.25 MG PO TABS: 6.2500 mg | ORAL_TABLET | Freq: Two times a day (BID) | ORAL | Status: AC

## 2014-09-15 MED ORDER — LORAZEPAM 2 MG/ML IJ SOLN
0.5000 mg | Freq: Once | INTRAMUSCULAR | Status: AC
  Administered 2014-09-15: 0.5 mg via INTRAMUSCULAR
  Filled 2014-09-15: qty 1

## 2014-09-15 MED ORDER — AMLODIPINE BESYLATE 5 MG PO TABS: 5.0000 mg | ORAL_TABLET | Freq: Every day | ORAL | Status: AC

## 2014-09-15 MED ORDER — RIVASTIGMINE 4.6 MG/24HR TD PT24: 4.6000 mg | MEDICATED_PATCH | Freq: Every day | TRANSDERMAL | Status: AC

## 2014-09-15 NOTE — Evaluation (Signed)
Clinical/Bedside Swallow Evaluation Patient Details  Name: Hunter Brown MRN: [id number] Date of Birth: 1950-06-03  Today's Date: 09/15/2014 Time: SLP Start Time (ACUTE ONLY): 18 SLP Stop Time (ACUTE ONLY): 1050 SLP Time Calculation (min) (ACUTE ONLY): 10 min  Past Medical History:  Past Medical History  Diagnosis Date  . Parkinson's disease   . Peripheral neuropathy   . Anxiety   . Scoliosis   . Osteoarthritis   . Nephrolithiasis   . Chest heaviness     started approximately one year ago 2014  . Shortness of breath     with exertion  . Elevated serum creatinine   . Skin cancer of face     Face, nose  . Hypertension    Past Surgical History:  Past Surgical History  Procedure Laterality Date  . Tonsillectomy    . Hemorroidectomy    . Burr hole w/ stereotactic insertion of dbs leads / intraop microelectrode recording    . Subthalamic stimulator battery replacement    . Back surgery      X 2  . Colonoscopy    . Upper gi endoscopy    . Subthalamic stimulator battery replacement N/A 02/26/2014    Procedure: Deep Brain Stimulator battery replacement;  Surgeon: Erline Levine, MD;  Location: [place] [facility];  Service: Neurosurgery;  Laterality: N/A;   HPI:  64 year old man with Parkinson's disease as well as what appears to be progressive dementia with newly exhibited changes, in addition to increasing memory difficulty. Per neurologist, history and clinical findings do not indicate a progression of manifestations of Parkinson's disease with no worsening of tremor and no worsening of his gait as well as no increase in stiffness. Speech and swallowing have remained unchanged. Pt has deep brain stimulator.    Assessment / Plan / Recommendation Clinical Impression  Pt demonstrates normal swallow function. No signs of weakness or decreased airway protection. Discussed signs of aspriation with wife and possiblity of dysphagia with Parkinson's Disease. Education complete, pt may  continue diet. No f/u needed.     Aspiration Risk  Mild    Diet Recommendation Regular;Thin liquid   Liquid Administration via: Cup;Straw Medication Administration: Whole meds with liquid Supervision: Patient able to self feed Postural Changes and/or Swallow Maneuvers: Seated upright 90 degrees    Other  Recommendations Oral Care Recommendations: Patient independent with oral care   Follow Up Recommendations  None    Frequency and Duration        Pertinent Vitals/Pain NA    SLP Swallow Goals     Swallow Study Prior Functional Status       General HPI: 64 year old man with Parkinson's disease as well as what appears to be progressive dementia with newly exhibited changes, in addition to increasing memory difficulty. Per neurologist, history and clinical findings do not indicate a progression of manifestations of Parkinson's disease with no worsening of tremor and no worsening of his gait as well as no increase in stiffness. Speech and swallowing have remained unchanged. Pt has deep brain stimulator.  Type of Study: Bedside swallow evaluation Previous Swallow Assessment: none Diet Prior to this Study: Regular;Thin liquids Temperature Spikes Noted: No Respiratory Status: Room air History of Recent Intubation: No Behavior/Cognition: Alert;Cooperative Oral Cavity - Dentition: Adequate natural dentition Self-Feeding Abilities: Able to feed self Patient Positioning: Upright in chair Baseline Vocal Quality: Clear Volitional Cough: Strong Volitional Swallow: Able to elicit    Oral/Motor/Sensory Function Overall Oral Motor/Sensory Function: Appears within functional limits for tasks assessed  Ice Chips     Thin Liquid Thin Liquid: Within functional limits    Nectar Thick Nectar Thick Liquid: Not tested   Honey Thick Honey Thick Liquid: Not tested   Puree Puree: Within functional limits   Solid   GO    Solid: Within functional limits      [facility], MA CCC-SLP  [phone]  Hunter Brown 09/15/2014,11:43 AM

## 2014-09-15 NOTE — Progress Notes (Signed)
PROGRESS NOTE  Hunter Brown NWG:[id number] DOB: 1951/04/17 DOA: 09/13/2014 PCP: Vladimir Crofts, MD  HPI/Recap of past 24 hours:  Calm, know he is at [facility], wife at bedside  Assessment/Plan: Principal Problem:   Altered mental status Active Problems:   Parkinson's disease   Essential hypertension   AKI (acute kidney injury)   Altered mental state  1. Confusion:  Progression of dementia? Reaction to recent meds adjustment? No infection or metabolic derangement identified so far.  Talked to neurology Dr. Carles Collet over the phone, Dr. Carles Collet reported that patient was referred to her about 25months ago due to increased confusion, her goal is trying to use his DBS more and taper off meds. Dr. Carles Collet reported patient have been on aricept prior to being referred to her, and no change of aricept by her. Dr tat also reported patient's wife called on 4/4 due to increased confusion, and Dr tat instructed the wife that to stop amantadine due to side effect of confusion.  Dr .tat also reported that patient had a basal cell on his scalp that was removed recently. He also received topical 5-fu for that. Dr Tat also reported she has noticed patient have been having chronic word finding troubles and at baseline has trouble following motor commend.  Clarified with wife,   aricept was never stopped.   Aricept.reordered on 4/16. Azilect was stopped a couple of month ago,  amantadine was stopped a few weeks ago due to concerning of confusion side effect.  UA/b12/tsh/rpr/hiv unremarkable.  EEG, indicative of a mild non specific encephalopathy   Radiologist called to report limited mri showed questionable Tiny lacunar infarct x1, but does has numerous chronic micro hemorrhages, no acute bleed, differential including long standing uncontrolled HTN vs angio amyloid.    Lipid panel at goal, awaiting carotic us/echo result  Neurology consulted,  Follow neuro rec's.  2. HTN: not on bp meds  previously, start norvasc and coreg  Patient has been on florinef at home for unclear reason, will clarify with wife, will check orthostatic vital signs?am cortisol level. No mention of history of adrenal insufficiency or orthostatic hypotension.   3. Parkinson's: s/p DBS, continue home meds sinemet.   4. Dementia: continue aricept, start trial of exelon during this admission.  5. Cough, crx unremarkable, lung exam benign, start flonase, does report postnasal drip, not sure if reliable.  6. Elevation of cr, 1.41 >1.36, possible baseline, from CKD from long term uncontrolled HTN, ua unremarkable.   Code Status: full  Family Communication: patient and wife  Disposition Plan: remain inpatient, likely d/c home tomorrow   Consultants:  neurology  Procedures:  Modified MRI ( medtronic tech assisted during procedure, due to history of DBS)  EEG  Echo/carotid US  Antibiotics:  none   Objective: BP 145/74 mmHg  Pulse 66  Temp(Src) 97.7 F (36.5 C) (Oral)  Resp 18  Ht 6\' 6"  (1.981 m)  Wt 88.7 kg (195 lb 8.8 oz)  BMI 22.60 kg/m2  SpO2 100%  Intake/Output Summary (Last 24 hours) at 09/15/14 1726 Last data filed at 09/15/14 0929  Gross per 24 hour  Intake    940 ml  Output    500 ml  Net    440 ml   Filed Weights   09/13/14 1854 09/13/14 2238  Weight: 88.7 kg (195 lb 8.8 oz) 88.7 kg (195 lb 8.8 oz)    Exam:   General:  NAD but not fully oriented  Cardiovascular: RRR  Respiratory: CTABL  Abdomen: Soft/ND/NT, positive BS  Musculoskeletal: No Edema  Neuro: only oriented to person, not following commend consistently, difficult to discern focal deficit. Does seem to have word finding difficulties. Not able to perform immediate recall.  Data Reviewed: Basic Metabolic Panel:  Recent Labs Lab 09/14/14 09/14/14 0522 09/14/14 1130 09/15/14 0649  NA  --  143  --  143  K  --  3.0*  --  3.6  CL  --  103  --  104  CO2  --  28  --  25  GLUCOSE  --  92  --   99  BUN  --  20  --  19  CREATININE 1.47* 1.41*  --  1.36*  CALCIUM  --  8.9  --  8.8  MG  --   --  1.9 1.8   Liver Function Tests:  Recent Labs Lab 09/14/14 0522  AST 17  ALT 8  ALKPHOS 83  BILITOT 0.9  PROT 7.0  ALBUMIN 3.6   No results for input(s): LIPASE, AMYLASE in the last 168 hours. No results for input(s): AMMONIA in the last 168 hours. CBC:  Recent Labs Lab 09/14/14 09/14/14 0522 09/15/14 0649  WBC 4.6 4.4 4.9  HGB 11.9* 12.7* 12.8*  HCT 36.5* 39.0 38.8*  MCV 89.5 89.9 90.7  PLT 147* 154 150   Cardiac Enzymes:    Recent Labs Lab 09/14/14 09/14/14 0522 09/14/14 1130  TROPONINI 0.03 0.03 0.03   BNP (last 3 results) No results for input(s): BNP in the last 8760 hours.  ProBNP (last 3 results) No results for input(s): PROBNP in the last 8760 hours.  CBG:  Recent Labs Lab 09/13/14 2256 09/15/14 0751  GLUCAP 142* 146*    No results found for this or any previous visit (from the past 240 hour(s)).   Studies: Dg Chest 2 View  09/14/2014   CLINICAL DATA:  Two week history of cough  EXAM: CHEST  2 VIEW  COMPARISON:  April 22, 2010  FINDINGS: There is no edema or consolidation. The heart size and pulmonary vascularity are normal. No adenopathy. There is degenerative change in the thoracic spine. There is a stimulator on the right with leads extending into the neck; tips not seen. Postoperative changes noted in the lower cervical spine region.  IMPRESSION: No edema or consolidation.   Electronically Signed   By: Lowella Grip III M.D.   On: 09/14/2014 15:31   Mr Brain Ltd W/o Cm  09/14/2014   ADDENDUM REPORT: 09/14/2014 16:36  ADDENDUM: Study discussed by telephone with Dr. Florencia Reasons on 09/14/2014 at 1615 hours.   Electronically Signed   By: Genevie Ann M.D.   On: 09/14/2014 16:36   09/14/2014   CLINICAL DATA:  64 year old male with chronic Parkinson disease and Medtronic deep brain stimulator. Mental status changes x3 months with increasing memory and  cognitive difficulty. Initial encounter.  Medtronic DBS-scanning parameters were followed for this exam.  EXAM: MRI HEAD WITHOUT CONTRAST  TECHNIQUE: Multiplanar, multiecho pulse sequences of the brain and surrounding structures were obtained without intravenous contrast.  COMPARISON:  None.  FINDINGS: Mild susceptibility artifact associated with bilateral vertex approach deep brain stimulator devices.  Cerebral volume is within normal limits for age. No midline shift, mass effect, evidence of mass lesion, ventriculomegaly, extra-axial collection or acute intracranial hemorrhage. Cervicomedullary junction and pituitary are within normal limits.  There are numerous small chronic micro hemorrhages scattered throughout the hemispheres (series 3). The deep gray matter nuclei, brainstem, and cerebellum  are relatively spared.  On diffusion-weighted imaging today there is a punctate area of abnormal trace diffusion signal in the posterior inferior left temporal lobe seen on series 6, image 9, and also on series 7, image 10. This does seem to be restricted but does not appear to be susceptibility artifact. No definite associated T2 signal abnormality. No other abnormal diffusion identified.  Visible internal auditory structures appear normal. Mastoids are clear. Moderate paranasal sinus mucosal thickening. Visualized orbit soft tissues are within normal limits. Negative visualized scalp soft tissues.  IMPRESSION: 1. Difficult to exclude a punctate 2. Acute lacunar infarct in the inferior left temporal lobe. Numerous chronic micro hemorrhages, primarily in the cerebral hemispheres. Main differential considerations are hypertensive encephalopathy, chronic small vessel disease, and amyloid angiopathy. 3. Bilateral deep brain stimulators in place, with associated mild artifact and subsequently imaging on this study were done in accordance with the Medtronic DBS-scanning parameters.  Electronically Signed: By: Genevie Ann M.D. On:  09/14/2014 15:59    Scheduled Meds: . ALPRAZolam  0.5 mg Oral TID  . amLODipine  5 mg Oral Daily  . aspirin EC  81 mg Oral Daily  . baclofen  10 mg Oral TID  . carbidopa-levodopa  1 tablet Oral Q6H  . carvedilol  6.25 mg Oral BID WC  . docusate sodium  100 mg Oral BID  . donepezil  5 mg Oral QHS  . fludrocortisone  0.1 mg Oral BID WC  . fluticasone  2 spray Each Nare Daily  . folic acid  1 mg Oral Daily  . heparin  5,000 Units Subcutaneous 3 times per day  . multivitamin with minerals  1 tablet Oral Daily  . rivastigmine  4.6 mg Transdermal Daily  . sodium chloride  3 mL Intravenous Q12H  . thiamine  100 mg Oral Daily    Continuous Infusions:     Time spent: >58mins  Sani Loiseau MD, PhD  [facility] Pager [phone]. If 7PM-7AM, please contact night-coverage at [email or web address], password [facility] 09/15/2014, 5:26 PM  LOS: 2 days

## 2014-09-15 NOTE — Progress Notes (Signed)
Pt is being discharged home. Discharge instructions were given to patient and family 

## 2014-09-15 NOTE — Progress Notes (Signed)
STROKE TEAM PROGRESS NOTE   HISTORY Vyncent Overby is a 64 y.o. male with Parkinson's disease, neuropathy, osteoarthritis, and hypertension, transferred from [facility] for management of her simply worsening mental status changes with increased confusion and inappropriate behavior. She was diagnosed with Parkinson's disease and 1994. He began showing mental status changes about 3 months ago with increasing memory difficulty and losing his train of thought. He was on Aricept 5 mg per day at that time which was discontinued. His wife noted no changes in his mental status after Aricept was stopped. Over the past several weeks he has been showing appropriate behavior with urinating in inappropriate places as well as day and night mixed up and attempting to go out of his house at night. The past few days his wife states that he has been sitting and staring with very minimal speech output. Symmetrel was discontinued about 10 days ago. Behavioral changes started prior to discontinuing Symmetrel, however. He has deep ring stimulator implanted, which was revised in September 2015. He is currently taking Sinemet 25-100 one tablet 4 times a day. No change in patients mild resting tremor has been noted on Symmetrel was discontinued. He is currently on no medication for memory difficulty. No change in speech or swallowing his been noted. His wife is also noticed no change in his gait or muscle tone.    SUBJECTIVE (INTERVAL HISTORY) His wife is at the bedside who provides most of the history.  Overall he feels his condition is gradually improving. He denies any focal symptoms suggestive of a stroke   OBJECTIVE Temp:  [97.7 F (36.5 C)-98.3 F (36.8 C)] 97.9 F (36.6 C) (04/16 0543) Pulse Rate:  [72-92] 92 (04/16 0543) Cardiac Rhythm:  [-] Normal sinus rhythm (04/15 1200) Resp:  [14-20] 18 (04/16 0543) BP: (139-179)/(66-88) 179/68 mmHg (04/16 0543) SpO2:  [97 %-99 %] 98 % (04/16 0543)   Recent Labs Lab  09/13/14 2256  GLUCAP 142*    Recent Labs Lab 09/14/14 09/14/14 0522 09/14/14 1130  NA  --  143  --   K  --  3.0*  --   CL  --  103  --   CO2  --  28  --   GLUCOSE  --  92  --   BUN  --  20  --   CREATININE 1.47* 1.41*  --   CALCIUM  --  8.9  --   MG  --   --  1.9    Recent Labs Lab 09/14/14 0522  AST 17  ALT 8  ALKPHOS 83  BILITOT 0.9  PROT 7.0  ALBUMIN 3.6    Recent Labs Lab 09/14/14 09/14/14 0522  WBC 4.6 4.4  HGB 11.9* 12.7*  HCT 36.5* 39.0  MCV 89.5 89.9  PLT 147* 154    Recent Labs Lab 09/14/14 09/14/14 0522 09/14/14 1130  TROPONINI 0.03 0.03 0.03   No results for input(s): LABPROT, INR in the last 72 hours.  Recent Labs  09/14/14 0830  COLORURINE YELLOW  LABSPEC 1.014  PHURINE 7.0  GLUCOSEU NEGATIVE  HGBUR NEGATIVE  BILIRUBINUR NEGATIVE  KETONESUR NEGATIVE  PROTEINUR NEGATIVE  UROBILINOGEN 0.2  NITRITE NEGATIVE  LEUKOCYTESUR NEGATIVE    No results found for: CHOL, TRIG, HDL, CHOLHDL, VLDL, LDLCALC Lab Results  Component Value Date   HGBA1C 5.7* 09/14/2014   No results found for: LABOPIA, COCAINSCRNUR, LABBENZ, AMPHETMU, THCU, LABBARB  No results for input(s): ETH in the last 168 hours.  Dg Chest 2 View 09/14/2014  No edema or consolidation.     Mr Brain Ltd W/o Cm 09/14/2014    1. Difficult to exclude a punctate acute lacunar infarct in the inferior left temporal lobe.  2. Numerous chronic micro hemorrhages, primarily in the cerebral hemispheres. Main differential considerations are hypertensive encephalopathy, chronic small vessel disease, and amyloid angiopathy.  3. Bilateral deep brain stimulators in place, with associated mild artifact and subsequently imaging on this study were done in accordance with the Medtronic DBS-scanning parameters.      PHYSICAL EXAM Pleasant frail elderly Caucasian male not in distress. . Afebrile. Head is nontraumatic. Neck is supple without bruit.    Cardiac exam no murmur or gallop. Lungs are  clear to auscultation. Distal pulses are well felt. Neurological Exam : Awake alert disoriented 3. Diminished attention, registration and recall. Cannot tell me who is the president. Follows only simple 1 step and midline commands. Mild diminished facial expression. Glabellar tap is positive. Palmomental reflexes are absent. Speech is slow and hesitant with mild hypophonia. No aphasia. Extraocular movements are full range without restriction. Fundi were not visualized. Blinks to threat bilaterally. Tongue is midline. Motor system exam revealed no upper or lower extremity drift symmetric and equal strength in all 4 extremities. No resting or action tremor noted. Mild cogwheel rigidity at both wrists upon activation only. Deep tendon pulses are 2+ symmetric except ankle jerks are depressed. Plantars are downgoing. Gait was not tested.     ASSESSMENT/PLAN Mr. Dominico Rod is a 64 y.o. male with history of Parkinson's disease, neuropathy, osteoarthritis, and hypertension, transferred from [facility] for management of her simply worsening mental status changes with increased confusion and inappropriate behavior.  He did not receive IV t-PA due to late presentation.   Possible stroke:  Dominant punctate acute lacunar infarct in the inferior left temporal lobe secondary to small vessel disease.   Resultant no focal stroke related symptoms. Baseline confusion/disorientation related to underlying mild dementia  MRI  as above  MRA  as above  Carotid Doppler pending  2D Echo pending  LDL pending  HgbA1c pending  Subcutaneous heparin for VTE prophylaxis  Diet regular Room service appropriate?: Yes; Fluid consistency:: Thin  no antithrombotic prior to admission, now on aspirin 81 mg orally every day  Ongoing aggressive stroke risk factor management  Therapy recommendations:  Pending  Disposition:  Pending  Hypertension  Home meds: No antihypertensive medications prior to  admission.  Stable  Hyperlipidemia  Home meds:  No lipid lowering medications prior to admission.  LDL pending, goal < 70     Other Stroke Risk Factors  Advanced age  Cigarette smoker, quit smoking 40 years ago.   Other Active Problems  Mildly elevated creatinine  Other Pertinent History    Hospital day # 2  Mikey Bussing PA-C [facility] Pager [phone] 09/15/2014, 8:41 AM I have personally examined this patient, reviewed notes, independently viewed imaging studies, participated in medical decision making and plan of care. I have made any additions or clarifications directly to the above note. Agree with note above. The patient has been having progressive confusion, disorientation for the last 23 weeks this is likely related to progression of his dementia and being off Aricept. He did and his wife deny any focal symptoms consistent with a stroke in the Limited MRI shows only a tiny week diffusion positive lesion which is not dark on the ADC and is of unclear significance and unlikely to be an acute stroke. He however remains at  risk for stroke given his age and risk factors and needs ongoing stroke evaluation and aggressive risk factor modification. Recommend start aspirin 81 mg daily. Agree with Dr. Les Pou recommendation of trial of Exelon patch. Recommend follow-up with his neurologist as an outpatient for further evaluation and treatment of his dementia as well as Parkinson's  Antony Contras, MD Medical Director [place] Pager: [phone] 09/15/2014 12:03 PM     To contact Stroke Continuity provider, please refer to [email or web address] After hours, contact General Neurology

## 2014-09-15 NOTE — Progress Notes (Signed)
  Echocardiogram 2D Echocardiogram has been performed.  Hunter Brown 09/15/2014, 5:01 PM

## 2014-09-15 NOTE — Discharge Summary (Signed)
Discharge Summary  [name] GBT:[id number] DOB: 1950-09-09  PCP: Hunter Crofts, MD  Admit date: 09/13/2014 Discharge date: 09/15/2014  Time spent: >72mins  Recommendations for Outpatient Follow-up:  1. F/u with PMD within a week to monitor blood pressure and titrate blood pressure meds. 2. F/u with neurology in a week for further titration of dementia and parkinson's meds.   Discharge Diagnoses:  Active Hospital Problems   Diagnosis Date Noted  . Altered mental status 09/13/2014  . Altered mental state   . Essential hypertension 09/13/2014  . AKI (acute kidney injury) 09/13/2014  . Parkinson's disease 02/26/2014    Resolved Hospital Problems   Diagnosis Date Noted Date Resolved  No resolved problems to display.    Discharge Condition: stable  Diet recommendation: heart healthy  Filed Weights   09/13/14 1854 09/13/14 2238  Weight: 88.7 kg (195 lb 8.8 oz) 88.7 kg (195 lb 8.8 oz)    History of present illness:  Hunter Brown is a 64 y.o. male presents with altered mental status. Patient was noted by his wife to be last in his usual state about 2 weeks ago. She states that over the last 2 weeks he has had a decline in his mental status. He has been confused and his wife states he sometimes stares off blankly. He has been going to the bathroom in the hallway. He was going from bed to bed and told his wife he was courting. He has had loss of urine control also. He has not been combative but sometimes he does get aggressive. He has no headaches noted. He has no fevers noted. He states there is no chest pain. He has no swelling in his legs. He has had frequent urination noted. He has had cough which is dry. He went to [facility] ED and was sent here as there is no neurology available. His wife states that he is doing a little better now. Patient does have parkinsons disease and is on therapy for this.  Hospital Course:  Principal Problem:   Altered mental status Active  Problems:   Parkinson's disease   Essential hypertension   AKI (acute kidney injury)   Altered mental state  1. Confusion:  etiology unclear, Progression of dementia? Reaction to recent meds adjustment? No infection or metabolic derangement identified so far.  Talked to neurology Dr. Wells Guiles Tat over the phone, Dr. Carles Collet reported that patient was referred to her about 3months ago due to increased confusion, her goal is trying to use his DBS more and taper off meds. Dr. Carles Collet reported patient have been on aricept prior to being referred to her, and no change of aricept by her. Dr tat also reported patient's wife called on 4/4 due to increased confusion, and Dr tat instructed the wife that to stop amantadine due to side effect of confusion.  Dr .tat also reported that patient had a basal cell on his scalp that was removed recently. He also received topical 5-fu for that. Dr Tat also reported she has noticed patient have been having chronic word finding troubles and at baseline has trouble following motor commend.  Clarified with wife,  aricept was never stopped. Aricept.reordered on 4/16. Azilect was stopped a couple of month ago,  amantadine was stopped a few weeks ago due to concerning of confusion side effect.  UA/b12/tsh/rpr/hiv unremarkable.  EEG, indicative of a mild non specific encephalopathy   Radiologist called to report limited mri showed questionable Tiny lacunar infarct x1, but does has numerous  chronic micro hemorrhages, no acute bleed, differential including long standing uncontrolled HTN vs angio amyloid.   Lipid panel at goal, awaiting carotic us/echo result  Neurology consulted, detail please see neurology notes.  2. HTN: not on bp meds previously, start norvasc and coreg  Patient has been on florinef , wife reported he has been on that for years for orthostatic hypotension, wife reported patient used to pass out, reported florinef help, no passing out, occasionally  dizzy in recently years, will continue it at discharge, defer to PMD to titrate off florinef if able.  3. Parkinson's: s/p DBS, continue home meds sinemet.   4. Dementia:  start trial of exelon during this admission.  5. Cough, crx unremarkable, lung exam benign, start flonase, does report postnasal drip, not sure if reliable.  6. Elevation of cr, 1.41 >1.36, possible baseline, from CKD from long term uncontrolled HTN, ua unremarkable.   Code Status: full  Family Communication: patient and wife  Disposition Plan: patient and wife requested to be discharge home today on 4/16   Consultants:  neurology  Procedures:  Modified MRI ( medtronic tech assisted during procedure, due to history of DBS)  EEG  Echo/carotid US  Antibiotics:  none   Discharge Exam: BP 145/74 mmHg  Pulse 66  Temp(Src) 97.7 F (36.5 C) (Oral)  Resp 18  Ht 6\' 6"  (1.981 m)  Wt 88.7 kg (195 lb 8.8 oz)  BMI 22.60 kg/m2  SpO2 100%    General: NAD but not fully oriented  HEENT: nontramatic, no sign of skin infection, no erythema.  Cardiovascular: RRR  Respiratory: CTABL  Abdomen: Soft/ND/NT, positive BS  Musculoskeletal: No Edema  Neuro: only oriented to person, not following commend consistently, difficult to discern focal deficit. Does seem to have word finding difficulties. Not able to perform immediate recall.   Discharge Instructions You were cared for by a hospitalist during your hospital stay. If you have any questions about your discharge medications or the care you received while you were in the hospital after you are discharged, you can call the unit and asked to speak with the hospitalist on call if the hospitalist that took care of you is not available. Once you are discharged, your primary care physician will handle any further medical issues. Please note that NO REFILLS for any discharge medications will be authorized once you are discharged, as it is imperative that you  return to your primary care physician (or establish a relationship with a primary care physician if you do not have one) for your aftercare needs so that they can reassess your need for medications and monitor your lab values.  Discharge Instructions    Diet - low sodium heart healthy    Complete by:  As directed      Increase activity slowly    Complete by:  As directed             Medication List    TAKE these medications        ALPRAZolam 0.5 MG tablet  Commonly known as:  XANAX  Take 0.5 mg by mouth 3 (three) times daily.     amLODipine 5 MG tablet  Commonly known as:  NORVASC  Take 1 tablet (5 mg total) by mouth daily.     baclofen 10 MG tablet  Commonly known as:  LIORESAL  Take 10 mg by mouth 3 (three) times daily.     carbidopa-levodopa 25-100 MG per tablet  Commonly known as:  SINEMET IR  Take 1 tablet by mouth 4 (four) times daily.     carvedilol 6.25 MG tablet  Commonly known as:  COREG  Take 1 tablet (6.25 mg total) by mouth 2 (two) times daily with a meal.     docusate sodium 100 MG capsule  Commonly known as:  COLACE  Take 100 mg by mouth 2 (two) times daily.     donepezil 5 MG tablet  Commonly known as:  ARICEPT  Take 5 mg by mouth daily at 12 noon.     etodolac 400 MG tablet  Commonly known as:  LODINE  Take 1 tablet (400 mg total) by mouth daily as needed for moderate pain (donot take on empty stomach).     fludrocortisone 0.1 MG tablet  Commonly known as:  FLORINEF  Take 0.1 mg by mouth 2 (two) times daily.     potassium chloride SA 20 MEQ tablet  Commonly known as:  K-DUR,KLOR-CON  Take 40 mEq by mouth daily.     rivastigmine 4.6 mg/24hr  Commonly known as:  EXELON  Place 1 patch (4.6 mg total) onto the skin daily.     sildenafil 100 MG tablet  Commonly known as:  VIAGRA  Take 100 mg by mouth daily as needed for erectile dysfunction.       No Known Allergies     Follow-up Information    Follow up with SPARKS,JEFFREY D, MD In 1  week.   Specialty:  Internal Medicine   Why:  for blood pressure control and continue titrate bloor pressure meds.   Contact information:   [place] [address] [phone]       Follow up with [facility] K, MD In 1 week.   Specialty:  Neurology   Why:  continue to titrate dementia/parkinson meds   Contact information:   [place] [facility] [place]  [address] [phone]        The results of significant diagnostics from this hospitalization (including imaging, microbiology, ancillary and laboratory) are listed below for reference.    Significant Diagnostic Studies: Dg Chest 2 View  09/14/2014   CLINICAL DATA:  Two week history of cough  EXAM: CHEST  2 VIEW  COMPARISON:  April 22, 2010  FINDINGS: There is no edema or consolidation. The heart size and pulmonary vascularity are normal. No adenopathy. There is degenerative change in the thoracic spine. There is a stimulator on the right with leads extending into the neck; tips not seen. Postoperative changes noted in the lower cervical spine region.  IMPRESSION: No edema or consolidation.   Electronically Signed   By: Lowella Grip III M.D.   On: 09/14/2014 15:31   Mr Brain Ltd W/o Cm  09/14/2014   ADDENDUM REPORT: 09/14/2014 16:36  ADDENDUM: Study discussed by telephone with Dr. Florencia Reasons on 09/14/2014 at 1615 hours.   Electronically Signed   By: Genevie Ann M.D.   On: 09/14/2014 16:36   09/14/2014   CLINICAL DATA:  64 year old male with chronic Parkinson disease and Medtronic deep brain stimulator. Mental status changes x3 months with increasing memory and cognitive difficulty. Initial encounter.  Medtronic DBS-scanning parameters were followed for this exam.  EXAM: MRI HEAD WITHOUT CONTRAST  TECHNIQUE: Multiplanar, multiecho pulse sequences of the brain and surrounding structures were obtained without intravenous contrast.  COMPARISON:  None.  FINDINGS:  Mild susceptibility artifact associated with bilateral vertex approach deep brain stimulator devices.  Cerebral volume is within normal  limits for age. No midline shift, mass effect, evidence of mass lesion, ventriculomegaly, extra-axial collection or acute intracranial hemorrhage. Cervicomedullary junction and pituitary are within normal limits.  There are numerous small chronic micro hemorrhages scattered throughout the hemispheres (series 3). The deep gray matter nuclei, brainstem, and cerebellum are relatively spared.  On diffusion-weighted imaging today there is a punctate area of abnormal trace diffusion signal in the posterior inferior left temporal lobe seen on series 6, image 9, and also on series 7, image 10. This does seem to be restricted but does not appear to be susceptibility artifact. No definite associated T2 signal abnormality. No other abnormal diffusion identified.  Visible internal auditory structures appear normal. Mastoids are clear. Moderate paranasal sinus mucosal thickening. Visualized orbit soft tissues are within normal limits. Negative visualized scalp soft tissues.  IMPRESSION: 1. Difficult to exclude a punctate 2. Acute lacunar infarct in the inferior left temporal lobe. Numerous chronic micro hemorrhages, primarily in the cerebral hemispheres. Main differential considerations are hypertensive encephalopathy, chronic small vessel disease, and amyloid angiopathy. 3. Bilateral deep brain stimulators in place, with associated mild artifact and subsequently imaging on this study were done in accordance with the Medtronic DBS-scanning parameters.  Electronically Signed: By: Genevie Ann M.D. On: 09/14/2014 15:59    Microbiology: No results found for this or any previous visit (from the past 240 hour(s)).   Labs: Basic Metabolic Panel:  Recent Labs Lab 09/14/14 09/14/14 0522 09/14/14 1130 09/15/14 0649  NA  --  143  --  143  K  --  3.0*  --  3.6  CL  --  103  --  104  CO2  --  28   --  25  GLUCOSE  --  92  --  99  BUN  --  20  --  19  CREATININE 1.47* 1.41*  --  1.36*  CALCIUM  --  8.9  --  8.8  MG  --   --  1.9 1.8   Liver Function Tests:  Recent Labs Lab 09/14/14 0522  AST 17  ALT 8  ALKPHOS 83  BILITOT 0.9  PROT 7.0  ALBUMIN 3.6   No results for input(s): LIPASE, AMYLASE in the last 168 hours. No results for input(s): AMMONIA in the last 168 hours. CBC:  Recent Labs Lab 09/14/14 09/14/14 0522 09/15/14 0649  WBC 4.6 4.4 4.9  HGB 11.9* 12.7* 12.8*  HCT 36.5* 39.0 38.8*  MCV 89.5 89.9 90.7  PLT 147* 154 150   Cardiac Enzymes:  Recent Labs Lab 09/14/14 09/14/14 0522 09/14/14 1130  TROPONINI 0.03 0.03 0.03   BNP: BNP (last 3 results) No results for input(s): BNP in the last 8760 hours.  ProBNP (last 3 results) No results for input(s): PROBNP in the last 8760 hours.  CBG:  Recent Labs Lab 09/13/14 2256 09/15/14 0751  GLUCAP 142* 146*       Signed:  Samiksha Pellicano MD, PhD  [facility] 09/15/2014, 5:39 PM

## 2014-09-15 NOTE — Progress Notes (Signed)
VASCULAR LAB PRELIMINARY  PRELIMINARY  PRELIMINARY  PRELIMINARY  Carotid Dopplers completed.    Preliminary report:  1-39% ICA stenosis.  Vertebral artery flow is antegrade.   Gian Ybarra, RVT 09/15/2014, 2:01 PM

## 2014-09-17 LAB — HEMOGLOBIN A1C
Hgb A1c MFr Bld: 5.8 % — ABNORMAL HIGH (ref 4.8–5.6)
Mean Plasma Glucose: 120 mg/dL

## 2014-09-17 LAB — FLUORESCENT TREPONEMAL AB(FTA)-IGG-BLD: Fluorescent Treponemal Ab, IgG: NONREACTIVE

## 2014-09-18 LAB — CULTURE, BLOOD (SINGLE)

## 2014-12-13 ENCOUNTER — Ambulatory Visit: Payer: Self-pay | Admitting: Neurology

## 2015-01-29 ENCOUNTER — Other Ambulatory Visit: Payer: Self-pay | Admitting: Neurology

## 2015-01-29 NOTE — Telephone Encounter (Signed)
Should this RX request go to Dr Melrose Nakayama?

## 2015-02-14 ENCOUNTER — Other Ambulatory Visit
Admission: RE | Admit: 2015-02-14 | Discharge: 2015-02-14 | Disposition: A | Discharge status: Home / Self Care | Payer: PPO | Source: Ambulatory Visit | Attending: Internal Medicine | Admitting: Internal Medicine

## 2015-02-14 DIAGNOSIS — Z79899 Other long term (current) drug therapy: Secondary | ICD-10-CM | POA: Diagnosis present

## 2015-02-14 LAB — CBC WITH DIFFERENTIAL/PLATELET
BASOS ABS: 0 10*3/uL (ref 0–0.1)
BASOS PCT: 0 %
EOS PCT: 2 %
Eosinophils Absolute: 0.1 10*3/uL (ref 0–0.7)
HCT: 43.7 % (ref 40.0–52.0)
Hemoglobin: 14.8 g/dL (ref 13.0–18.0)
LYMPHS PCT: 31 %
Lymphs Abs: 1.9 10*3/uL (ref 1.0–3.6)
MCH: 30.2 pg (ref 26.0–34.0)
MCHC: 33.8 g/dL (ref 32.0–36.0)
MCV: 89.2 fL (ref 80.0–100.0)
Monocytes Absolute: 0.7 10*3/uL (ref 0.2–1.0)
Monocytes Relative: 11 %
Neutro Abs: 3.6 10*3/uL (ref 1.4–6.5)
Neutrophils Relative %: 56 %
PLATELETS: 176 10*3/uL (ref 150–440)
RBC: 4.9 MIL/uL (ref 4.40–5.90)
RDW: 13.8 % (ref 11.5–14.5)
WBC: 6.4 10*3/uL (ref 3.8–10.6)

## 2015-02-14 LAB — URINALYSIS COMPLETE WITH MICROSCOPIC (ARMC ONLY)
BILIRUBIN URINE: NEGATIVE
Bacteria, UA: NONE SEEN
GLUCOSE, UA: NEGATIVE mg/dL
Hgb urine dipstick: NEGATIVE
Leukocytes, UA: NEGATIVE
NITRITE: NEGATIVE
PH: 5 (ref 5.0–8.0)
Protein, ur: NEGATIVE mg/dL
RBC / HPF: NONE SEEN RBC/hpf (ref 0–5)
Specific Gravity, Urine: 1.021 (ref 1.005–1.030)
Squamous Epithelial / LPF: NONE SEEN

## 2015-02-14 LAB — COMPREHENSIVE METABOLIC PANEL
ALBUMIN: 4.2 g/dL (ref 3.5–5.0)
ALK PHOS: 72 U/L (ref 38–126)
ALT: <5 U/L — ABNORMAL LOW (ref 17–63)
AST: 22 U/L (ref 15–41)
Anion gap: 9 (ref 5–15)
BILIRUBIN TOTAL: 1.1 mg/dL (ref 0.3–1.2)
BUN: 32 mg/dL — AB (ref 6–20)
CALCIUM: 9.6 mg/dL (ref 8.9–10.3)
CO2: 28 mmol/L (ref 22–32)
Chloride: 104 mmol/L (ref 101–111)
Creatinine, Ser: 2.04 mg/dL — ABNORMAL HIGH (ref 0.61–1.24)
GFR calc Af Amer: 38 mL/min — ABNORMAL LOW (ref >60)
GFR, EST NON AFRICAN AMERICAN: 33 mL/min — AB (ref >60)
GLUCOSE: 103 mg/dL — AB (ref 65–99)
POTASSIUM: 4.3 mmol/L (ref 3.5–5.1)
Sodium: 141 mmol/L (ref 135–145)
TOTAL PROTEIN: 7.9 g/dL (ref 6.5–8.1)

## 2015-02-14 LAB — TSH: TSH: 2.682 u[IU]/mL (ref 0.350–4.500)

## 2015-06-25 ENCOUNTER — Other Ambulatory Visit: Payer: Self-pay | Admitting: Neurology

## 2015-06-25 DIAGNOSIS — R4781 Slurred speech: Secondary | ICD-10-CM

## 2015-07-03 ENCOUNTER — Ambulatory Visit
Admission: RE | Admit: 2015-07-03 | Discharge: 2015-07-03 | Disposition: A | Discharge status: Home / Self Care | Payer: PPO | Source: Ambulatory Visit | Attending: Neurology | Admitting: Neurology

## 2015-07-03 DIAGNOSIS — R4781 Slurred speech: Secondary | ICD-10-CM

## 2015-07-03 DIAGNOSIS — R404 Transient alteration of awareness: Secondary | ICD-10-CM | POA: Diagnosis present

## 2015-07-03 DIAGNOSIS — Z8673 Personal history of transient ischemic attack (TIA), and cerebral infarction without residual deficits: Secondary | ICD-10-CM | POA: Insufficient documentation

## 2015-12-23 IMAGING — CR DG CHEST 2V
1 series · 2 of 2 positions shown · non-contrast
Comparison: June 15, 2013

CLINICAL DATA: Difficulty breathing

EXAM:
CHEST  2 VIEW

[Series 1: dxr chest pa (or ap) and lateral · 0.14mm/px · 2 of 2 slices shown]
[im 1/2]
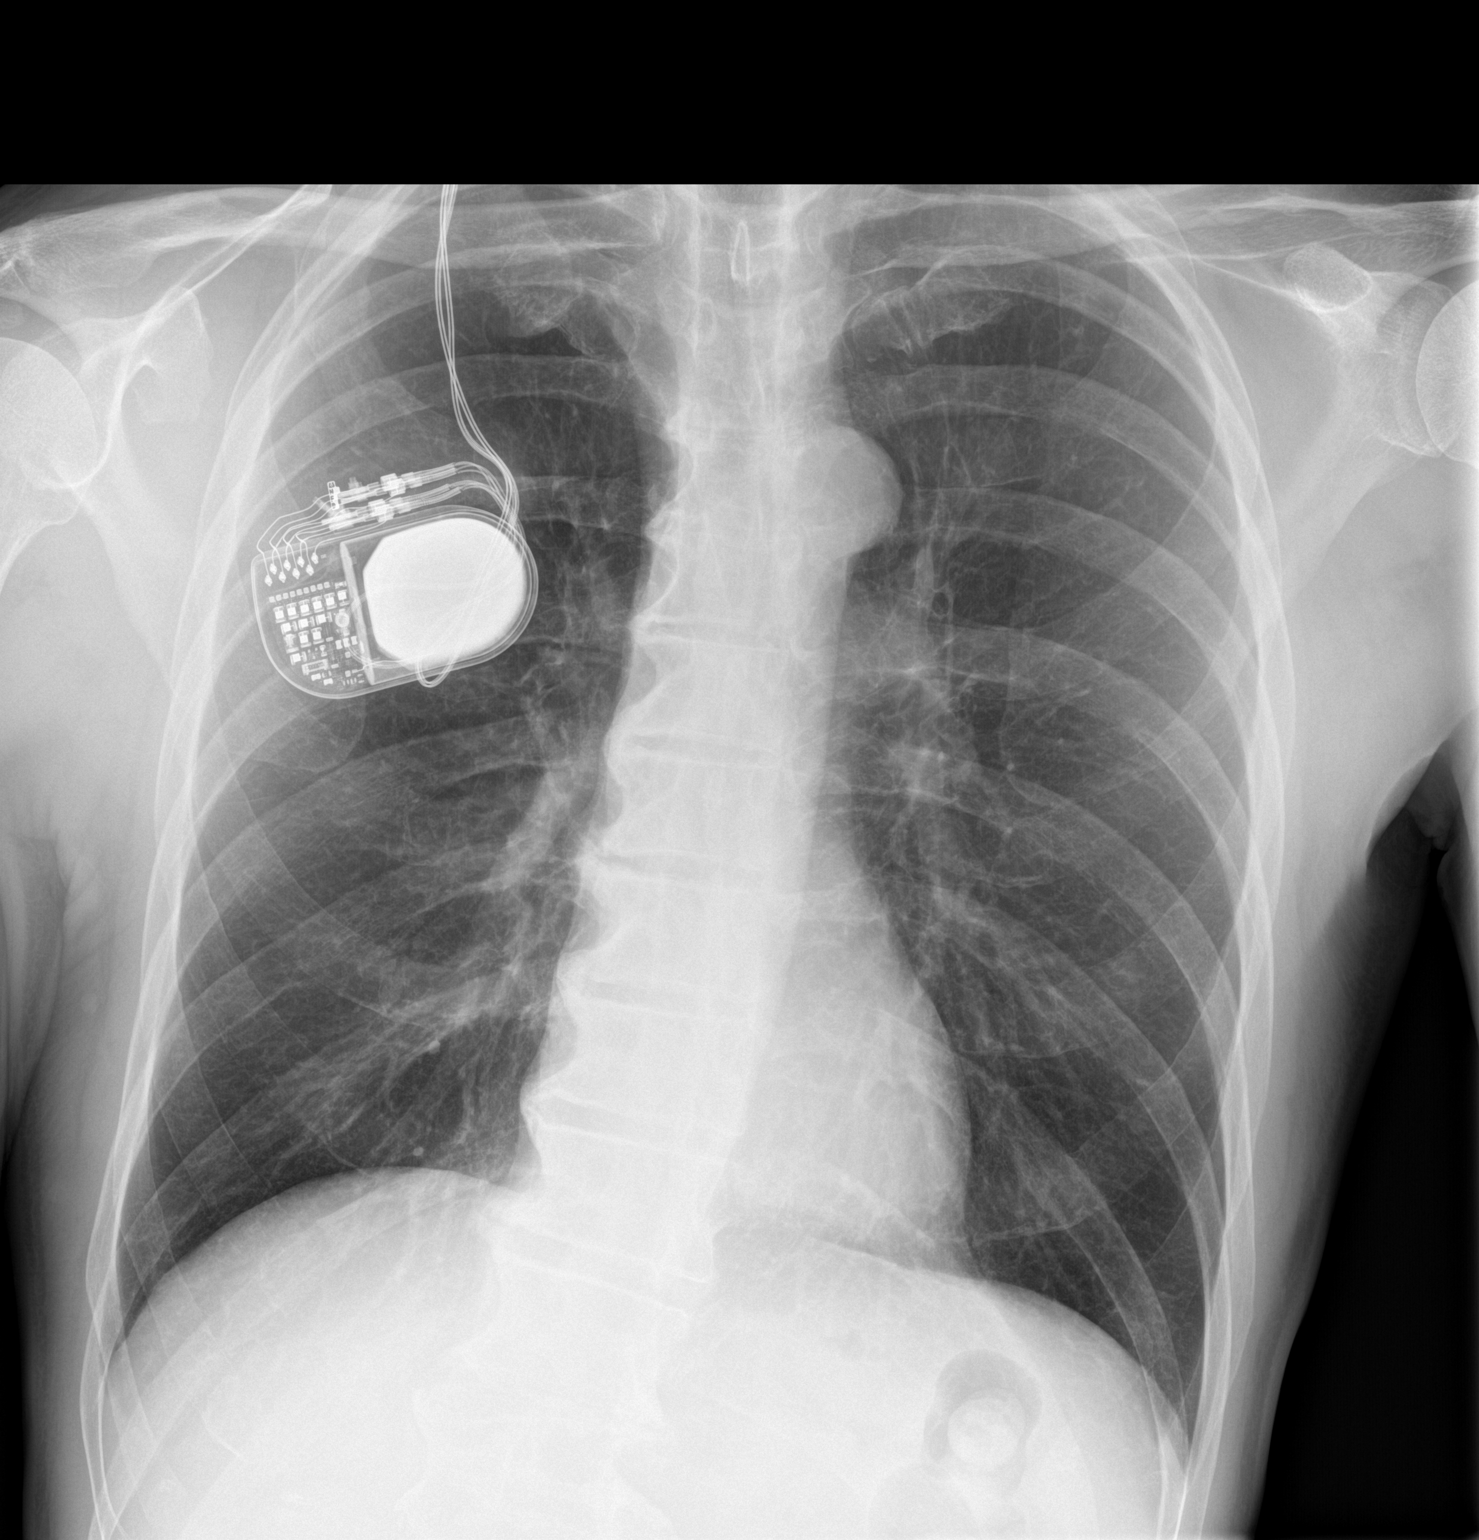
[im 2/2]
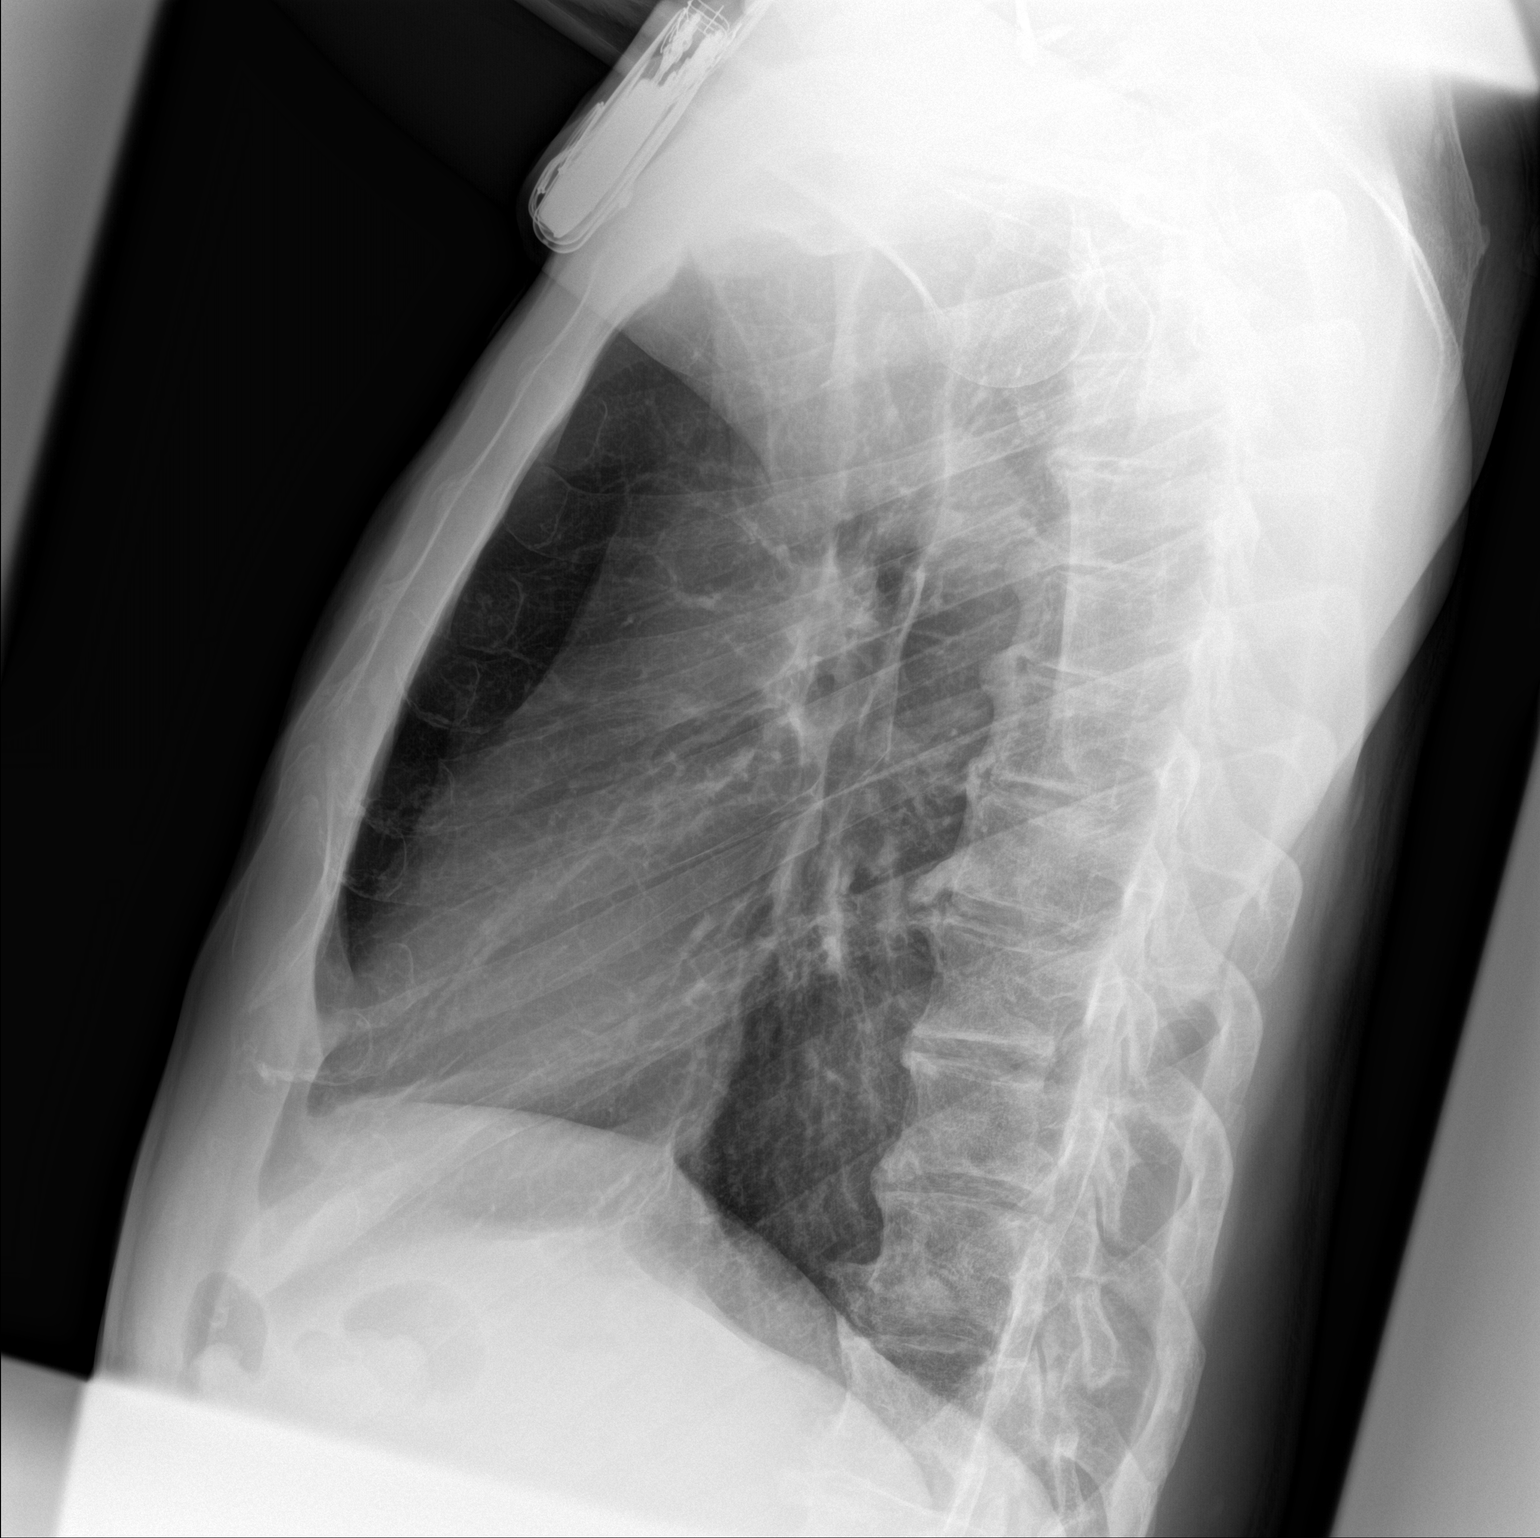

[2 of 2 positions shown; findings below may reference images not displayed]

FINDINGS: There is underlying emphysematous change. There is no edema or
consolidation. The heart size is within normal limits. Pulmonary
vascularity reflects underlying emphysema. No adenopathy. A
stimulator is seen overlying the right hemi thorax. There is
postoperative change in the lower cervical region.
IMPRESSION: Underlying emphysematous change. No apparent edema or consolidation.

## 2016-03-08 ENCOUNTER — Encounter
Admission: RE | Admit: 2016-03-08 | Discharge: 2016-03-08 | Disposition: A | Discharge status: Home / Self Care | Payer: Medicare Other | Source: Ambulatory Visit | Attending: Internal Medicine | Admitting: Internal Medicine

## 2016-03-17 ENCOUNTER — Non-Acute Institutional Stay (SKILLED_NURSING_FACILITY): Payer: PPO | Admitting: Gerontology

## 2016-03-17 DIAGNOSIS — R4182 Altered mental status, unspecified: Secondary | ICD-10-CM | POA: Diagnosis not present

## 2016-03-18 ENCOUNTER — Non-Acute Institutional Stay (SKILLED_NURSING_FACILITY): Payer: PPO | Admitting: Gerontology

## 2016-03-18 DIAGNOSIS — J189 Pneumonia, unspecified organism: Secondary | ICD-10-CM

## 2016-03-18 DIAGNOSIS — N390 Urinary tract infection, site not specified: Secondary | ICD-10-CM

## 2016-03-18 LAB — VITAMIN B12: Vitamin B-12: 598 pg/mL (ref 180–914)

## 2016-03-18 LAB — URINALYSIS COMPLETE WITH MICROSCOPIC (ARMC ONLY)
Bilirubin Urine: NEGATIVE
Glucose, UA: NEGATIVE mg/dL
Hgb urine dipstick: NEGATIVE
KETONES UR: NEGATIVE mg/dL
NITRITE: POSITIVE — AB
PH: 6 (ref 5.0–8.0)
PROTEIN: NEGATIVE mg/dL
SPECIFIC GRAVITY, URINE: 1.014 (ref 1.005–1.030)
SQUAMOUS EPITHELIAL / LPF: NONE SEEN

## 2016-03-18 LAB — CBC WITH DIFFERENTIAL/PLATELET
BASOS ABS: 0 10*3/uL (ref 0–0.1)
BLASTS: 0 %
Band Neutrophils: 0 %
Basophils Relative: 0 %
EOS ABS: 0.6 10*3/uL (ref 0–0.7)
Eosinophils Relative: 9 %
HEMATOCRIT: 31.4 % — AB (ref 40.0–52.0)
HEMOGLOBIN: 10.5 g/dL — AB (ref 13.0–18.0)
Lymphocytes Relative: 33 %
Lymphs Abs: 2.2 10*3/uL (ref 1.0–3.6)
MCH: 28.7 pg (ref 26.0–34.0)
MCHC: 33.3 g/dL (ref 32.0–36.0)
MCV: 86.3 fL (ref 80.0–100.0)
METAMYELOCYTES PCT: 0 %
MONOS PCT: 24 %
Monocytes Absolute: 1.6 10*3/uL — ABNORMAL HIGH (ref 0.2–1.0)
Myelocytes: 0 %
NEUTROS ABS: 2.4 10*3/uL (ref 1.4–6.5)
Neutrophils Relative %: 34 %
Other: 0 %
PROMYELOCYTES ABS: 0 %
Platelets: 262 10*3/uL (ref 150–440)
RBC: 3.64 MIL/uL — AB (ref 4.40–5.90)
RDW: 15.1 % — AB (ref 11.5–14.5)
WBC: 6.8 10*3/uL (ref 3.8–10.6)
nRBC: 0 /100 WBC

## 2016-03-18 LAB — COMPREHENSIVE METABOLIC PANEL
ALBUMIN: 2.4 g/dL — AB (ref 3.5–5.0)
ALK PHOS: 100 U/L (ref 38–126)
ALT: 17 U/L (ref 17–63)
AST: 57 U/L — AB (ref 15–41)
Anion gap: 6 (ref 5–15)
BILIRUBIN TOTAL: 0.7 mg/dL (ref 0.3–1.2)
BUN: 35 mg/dL — AB (ref 6–20)
CALCIUM: 9 mg/dL (ref 8.9–10.3)
CO2: 31 mmol/L (ref 22–32)
CREATININE: 1.4 mg/dL — AB (ref 0.61–1.24)
Chloride: 102 mmol/L (ref 101–111)
GFR calc Af Amer: 59 mL/min — ABNORMAL LOW (ref >60)
GFR calc non Af Amer: 51 mL/min — ABNORMAL LOW (ref >60)
GLUCOSE: 104 mg/dL — AB (ref 65–99)
Potassium: 4.2 mmol/L (ref 3.5–5.1)
SODIUM: 139 mmol/L (ref 135–145)
TOTAL PROTEIN: 6.8 g/dL (ref 6.5–8.1)

## 2016-03-18 LAB — TSH: TSH: 4.616 u[IU]/mL — ABNORMAL HIGH (ref 0.350–4.500)

## 2016-03-18 LAB — MAGNESIUM: Magnesium: 1.9 mg/dL (ref 1.7–2.4)

## 2016-03-18 LAB — AMMONIA: AMMONIA: 41 umol/L — AB (ref 9–35)

## 2016-03-18 NOTE — Progress Notes (Signed)
Location:      Place of Service:  SNF (31) Provider:  Toni Arthurs, NP-C  [facility], MD  Patient Care Team: Vladimir Crofts, MD as PCP - General (Neurology) Vladimir Crofts, MD (Neurology)  Extended Emergency Contact Information Primary Emergency Contact: Szilagyi,Linda Address: [place], [place] [address] Johnnette Litter of [place] Phone: [phone] Relation: Spouse Secondary Emergency Contact: Asher Muir [place] of [place] Phone: [phone] Relation: Son  Code Status:  Full Goals of care: Advanced Directive information Advanced Directives 02/22/2014  Does patient have an advance directive? No  Would patient like information on creating an advanced directive? No - patient declined information     Chief Complaint  Patient presents with  . Acute Visit    HPI:  Pt is a 65 y.o. male seen today for an acute visit for Altered mental status. Pt was admitted to the facility for rehab following extensive back surgery at [facility] for scoliosis. It was a 9 hour surgery. Apparently, the pt's DBS was turned off during the surgery. He was difficult to extubate. Pt began pocketing food 3 days post-op, Dobbhoff tube was placed with initiation of artificial nutrition as supplementation to some PO intake. Meds were given through tube. Pt remained hospitalized for 26 days. Pt developed severe delirium, was in ICU. At one point, his Parkinson's meds, etc were abruptly stopped. Pt was transferred to the facility to rehab unable to walk, talk, eat, maintain control of muscles/ limbs, spasticity, mumbling, drooling, combative and incontinent. Wife was understandably upset and concerned. Some meds were re-introduced. Wife reports some improvement after this. Wife shared a video she took of pt the day before surgery. He was walking in his driveway with steady gait, no alterations. Pt was conversant and able to feed himself. Wife reports pt was "90% independent" with only the  occasional hallucination that was short lived, occurring when pt was tired/ awakened suddenly from sleep. Pt has a Deep Brain Stimulator that took pt from having to take 44 pills/ day, down to taking 16 pills/ day- has had 2 battery replacements thus far. Per our recommendation, pt was referred back to Dr Manuella Ghazi, pt's Neurologist. No med changes made beyond current regimen at the facility. Wife insistent on removal of the feeding tube. She verbalized understanding to me the reasons he had the tube and understanding to the fact that if he is not able to eat an adequate amount and/or looses weight/ aspirates, etc, he would have to return to the hospital to have the tube replaced. Wife also insistent on pt getting up to the [facility] for BMs despite his rigidity and uncoordinated movements. Pt has had several vaso-vagal responses recently while attempting to get pt on the toilet. Wife is aware of the risks. Wife wants pt to eat "regular foods." She is aware of the risks of aspiration and is instructed to not allow pt to eat fast, etc. Wife agreeable to a Palliative Care consult  D/t pt's change in medical condition, new baseline???, etc. We discussed my plan for workup, including labs, xrays, possibly CT scan, possibly LP. Wife agreeable to all these. At this time, VSS. Pt unable to verbalize complaints, pain, dyspnea, etc. Wife denies n/v/f/c. Wife reports some loose stools recently and new onset incontinence. Multidisciplinary team updated.   Past Medical History:  Diagnosis Date  . Anxiety   . Chest heaviness    started approximately one year  ago 2014  . Elevated serum creatinine   . Hypertension   . Nephrolithiasis   . Osteoarthritis   . Parkinson's disease   . Peripheral neuropathy   . Scoliosis   . Shortness of breath    with exertion  . Skin cancer of face    Face, nose   Past Surgical History:  Procedure Laterality Date  . BACK SURGERY     X 2  . BURR HOLE W/ STEREOTACTIC INSERTION OF DBS LEADS  / INTRAOP MICROELECTRODE RECORDING    . COLONOSCOPY    . HEMORROIDECTOMY    . SUBTHALAMIC STIMULATOR BATTERY REPLACEMENT    . SUBTHALAMIC STIMULATOR BATTERY REPLACEMENT N/A 02/26/2014   Procedure: Deep Brain Stimulator battery replacement;  Surgeon: Erline Levine, MD;  Location: [place] [facility];  Service: Neurosurgery;  Laterality: N/A;  . TONSILLECTOMY    . UPPER GI ENDOSCOPY      No Known Allergies    Medication List       Accurate as of 03/17/16 11:59 PM. Always use your most recent med list.          ALPRAZolam 0.5 MG tablet Commonly known as:  XANAX Take 0.5 mg by mouth 3 (three) times daily.   amLODipine 5 MG tablet Commonly known as:  NORVASC Take 1 tablet (5 mg total) by mouth daily.   baclofen 10 MG tablet Commonly known as:  LIORESAL Take 10 mg by mouth 3 (three) times daily.   carbidopa-levodopa 25-100 MG tablet Commonly known as:  SINEMET IR Take 1 tablet by mouth 4 (four) times daily.   carvedilol 6.25 MG tablet Commonly known as:  COREG Take 1 tablet (6.25 mg total) by mouth 2 (two) times daily with a meal.   docusate sodium 100 MG capsule Commonly known as:  COLACE Take 100 mg by mouth 2 (two) times daily.   donepezil 5 MG tablet Commonly known as:  ARICEPT Take 5 mg by mouth daily at 12 noon.   etodolac 400 MG tablet Commonly known as:  LODINE Take 1 tablet (400 mg total) by mouth daily as needed for moderate pain (donot take on empty stomach).   fludrocortisone 0.1 MG tablet Commonly known as:  FLORINEF Take 0.1 mg by mouth 2 (two) times daily.   potassium chloride SA 20 MEQ tablet Commonly known as:  K-DUR,KLOR-CON Take 40 mEq by mouth daily.   rivastigmine 4.6 mg/24hr Commonly known as:  EXELON Place 1 patch (4.6 mg total) onto the skin daily.   sildenafil 100 MG tablet Commonly known as:  VIAGRA Take 100 mg by mouth daily as needed for erectile dysfunction.       Review of Systems  Unable to perform ROS: Mental status change (ROS  given by wife)  Constitutional: Positive for activity change and appetite change. Negative for chills, diaphoresis, fatigue and fever.  HENT: Positive for drooling.   Eyes: Negative for pain, redness and visual disturbance.  Respiratory: Negative for apnea, cough, choking, chest tightness, shortness of breath and wheezing.   Cardiovascular: Negative for chest pain, palpitations and leg swelling.  Gastrointestinal: Positive for constipation and diarrhea. Negative for abdominal distention, abdominal pain, nausea and vomiting.  Genitourinary: Negative for decreased urine volume, difficulty urinating, dysuria, frequency, hematuria and urgency.  Musculoskeletal: Positive for gait problem. Negative for arthralgias (typical arthritis), back pain and myalgias.  Skin: Positive for pallor and wound (very long incision on back). Negative for color change and rash.  Neurological: Positive for syncope, speech difficulty and weakness. Negative  for dizziness, tremors, seizures, facial asymmetry, light-headedness, numbness and headaches.  Psychiatric/Behavioral: Positive for agitation, confusion, decreased concentration, hallucinations and sleep disturbance. Negative for behavioral problems.  All other systems reviewed and are negative.    There is no immunization history on file for this patient. Pertinent  Health Maintenance Due  Topic Date Due  . COLONOSCOPY  07/01/1999  . PNA vac Low Risk Adult (1 of 2 - PCV13) 06/30/2014  . INFLUENZA VACCINE  12/31/2015   No flowsheet data found. Functional Status Survey:    Vitals:   03/17/16 0500  BP: (!) 117/101  Pulse: 63  Resp: 20  Temp: 97.2 F (36.2 C)  SpO2: 98%  Weight: 206 lb 8 oz (93.7 kg)   Body mass index is 23.86 kg/m. Physical Exam  Constitutional: Vital signs are normal. He appears well-developed and well-nourished. He appears lethargic. He is sleeping, active and uncooperative. He appears ill. No distress.  HENT:  Head: Normocephalic  and atraumatic.  Mouth/Throat: Uvula is midline, oropharynx is clear and moist and mucous membranes are normal. Mucous membranes are not pale, not dry and not cyanotic.  Eyes: Conjunctivae, EOM and lids are normal. Pupils are equal, round, and reactive to light.  Neck: Trachea normal, normal range of motion and full passive range of motion without pain. Neck supple. No JVD present. No tracheal deviation, no edema and no erythema present. No thyromegaly present.  Cardiovascular: Normal rate, regular rhythm, normal heart sounds, intact distal pulses and normal pulses.  Exam reveals no gallop, no distant heart sounds and no friction rub.   No murmur heard. Pulmonary/Chest: Effort normal and breath sounds normal. No accessory muscle usage. No respiratory distress. He has no decreased breath sounds. He has no wheezes. He has no rhonchi. He has no rales. He exhibits no tenderness.  Abdominal: Soft. Normal appearance. He exhibits no distension and no ascites. Bowel sounds are decreased. There is tenderness in the right lower quadrant, suprapubic area and left lower quadrant.  Musculoskeletal: He exhibits no edema or tenderness.       Lumbar back: He exhibits decreased range of motion and laceration (long incision- healed). He exhibits no tenderness, no swelling, no edema and no pain.  Expected osteoarthritis, stiffness  Neurological: He appears lethargic. He is disoriented. He displays abnormal reflex. A cranial nerve deficit and sensory deficit is present. He exhibits abnormal muscle tone (rigidity). Coordination and gait abnormal. GCS eye subscore is 3. GCS verbal subscore is 2. GCS motor subscore is 4.  Skin: Skin is warm, dry and intact. He is not diaphoretic. No cyanosis. No pallor. Nails show no clubbing.     Psychiatric: Thought content normal. His affect is blunt. His speech is delayed and slurred. He is slowed and actively hallucinating. Cognition and memory are impaired. He expresses impulsivity.  He is inattentive.  Nursing note and vitals reviewed.   Labs reviewed:  Recent Labs  03/18/16 0445  NA 139  K 4.2  CL 102  CO2 31  GLUCOSE 104*  BUN 35*  CREATININE 1.40*  CALCIUM 9.0  MG 1.9    Recent Labs  03/18/16 0445  AST 57*  ALT 17  ALKPHOS 100  BILITOT 0.7  PROT 6.8  ALBUMIN 2.4*    Recent Labs  03/18/16 0445  WBC 6.8  NEUTROABS 2.4  HGB 10.5*  HCT 31.4*  MCV 86.3  PLT 262   Lab Results  Component Value Date   TSH 4.616 (H) 03/18/2016   Lab Results  Component Value  Date   HGBA1C 5.8 (H) 09/14/2014   Lab Results  Component Value Date   CHOL 151 09/15/2014   HDL 54 09/15/2014   LDLCALC 86 09/15/2014   TRIG 57 09/15/2014   CHOLHDL 2.8 09/15/2014    Significant Diagnostic Results in last 30 days:  No results found.  Assessment/Plan 1. Altered mental status, unspecified altered mental status type  Check labs as listed below  Check radiological studies as listed below  Consider CT scan of the brain  Consider Lumbar Puncture  Staff to assist pt to the [facility] to facilitate BMs  DC Dobhoff tube if abdominal xrays negative  Palliative Care Consult  Refer back to Neurosurgeon that manages DBS to assess functionality  Family/ staff Communication:   Total Time: 95 minutes  Documentation/ review of studies/ notes: 30 minutes  Face to Face: 65 minutes  Family/Phone: Extensive conversation with wife regarding her concerns/ frustrations. She talked about his history prior to surgery, immediately after surgery and present. She expressed to me her wishes for care. We discussed a plan for work up, etc. Total time spent with pt and wife face to face: 65 minutes   Labs/tests ordered:  Cbc, met c, tsh, mag+, B12, D, ammonia, ua, c&s, 2-v cxr, complete view abdomen.   Medication list reviewed and assessed for continued appropriateness.  Vikki Ports, NP-C Geriatrics [facility] [phone],  [address] Cell Phone (Mon-Fri 8am-5pm):  [phone] On Call:  [phone] & follow prompts after 5pm & weekends Office Phone:  [phone] Office Fax:  [phone]

## 2016-03-18 NOTE — Progress Notes (Signed)
Location:      Place of Service:  SNF (31) Provider:  Toni Arthurs, NP-C  [facility] MD  Patient Care Team: Vladimir Crofts, MD as PCP - General (Neurology) Vladimir Crofts, MD (Neurology)  Extended Emergency Contact Information Primary Emergency Contact: Mcbryar,Linda Address: [place], [place] [address] Johnnette Litter of [place] Phone: [phone] Relation: Spouse Secondary Emergency Contact: Asher Muir [place] of [place] Phone: [phone] Relation: Son  Code Status:  full Goals of care: Advanced Directive information Advanced Directives 02/22/2014  Does patient have an advance directive? No  Would patient like information on creating an advanced directive? No - patient declined information     Chief Complaint  Patient presents with  . Follow-up    HPI:  Pt is a 65 y.o. male seen today for a f/u s/p extensive conversations with wife yesterday followed by lab testing and radiological surveys. Pt was found to have a UTI, cx pending. Also, pt was found to have LLL PNA. Wife was agreeable to IV antibiotics. Will follow up with cultures when available for medication adjustments. Pt appears to be more calm this morning. Pt appears to feel better. Wife reports pt ate an omelet this morning. He had a brief episode of "choking" that quickly cleared d/t pt eating too fast. VSS. Will continue to assess frequently. No reports of cough, congestion.  Past Medical History:  Diagnosis Date  . Anxiety   . Chest heaviness    started approximately one year ago 2014  . Elevated serum creatinine   . Hypertension   . Nephrolithiasis   . Osteoarthritis   . Parkinson's disease   . Peripheral neuropathy   . Scoliosis   . Shortness of breath    with exertion  . Skin cancer of face    Face, nose   Past Surgical History:  Procedure Laterality Date  . BACK SURGERY     X 2  . BURR HOLE W/ STEREOTACTIC INSERTION OF DBS LEADS / INTRAOP MICROELECTRODE  RECORDING    . COLONOSCOPY    . HEMORROIDECTOMY    . SUBTHALAMIC STIMULATOR BATTERY REPLACEMENT    . SUBTHALAMIC STIMULATOR BATTERY REPLACEMENT N/A 02/26/2014   Procedure: Deep Brain Stimulator battery replacement;  Surgeon: Erline Levine, MD;  Location: [place] NEURO ORS;  Service: Neurosurgery;  Laterality: N/A;  . TONSILLECTOMY    . UPPER GI ENDOSCOPY      No Known Allergies    Medication List       Accurate as of 03/18/16  6:23 PM. Always use your most recent med list.          ALPRAZolam 0.5 MG tablet Commonly known as:  XANAX Take 0.5 mg by mouth 3 (three) times daily.   amLODipine 5 MG tablet Commonly known as:  NORVASC Take 1 tablet (5 mg total) by mouth daily.   baclofen 10 MG tablet Commonly known as:  LIORESAL Take 10 mg by mouth 3 (three) times daily.   carbidopa-levodopa 25-100 MG tablet Commonly known as:  SINEMET IR Take 1 tablet by mouth 4 (four) times daily.   carvedilol 6.25 MG tablet Commonly known as:  COREG Take 1 tablet (6.25 mg total) by mouth 2 (two) times daily with a meal.   docusate sodium 100 MG capsule Commonly known as:  COLACE Take 100 mg by mouth 2 (two) times daily.   donepezil 5 MG tablet Commonly known as:  ARICEPT  Take 5 mg by mouth daily at 12 noon.   etodolac 400 MG tablet Commonly known as:  LODINE Take 1 tablet (400 mg total) by mouth daily as needed for moderate pain (donot take on empty stomach).   fludrocortisone 0.1 MG tablet Commonly known as:  FLORINEF Take 0.1 mg by mouth 2 (two) times daily.   potassium chloride SA 20 MEQ tablet Commonly known as:  K-DUR,KLOR-CON Take 40 mEq by mouth daily.   rivastigmine 4.6 mg/24hr Commonly known as:  EXELON Place 1 patch (4.6 mg total) onto the skin daily.   sildenafil 100 MG tablet Commonly known as:  VIAGRA Take 100 mg by mouth daily as needed for erectile dysfunction.       Review of Systems  Unable to perform ROS: Mental status change (ROS given by wife)    Constitutional: Positive for activity change and appetite change. Negative for chills, diaphoresis, fatigue and fever.  HENT: Negative for drooling.   Respiratory: Positive for choking (eating too fast). Negative for apnea, cough, chest tightness, shortness of breath and wheezing.   Cardiovascular: Negative for chest pain, palpitations and leg swelling.  Gastrointestinal: Positive for constipation and diarrhea. Negative for abdominal distention, abdominal pain, nausea and vomiting.  Genitourinary: Negative for decreased urine volume, difficulty urinating, dysuria, frequency, hematuria and urgency.  Musculoskeletal: Positive for gait problem. Negative for arthralgias (typical arthritis), back pain and myalgias.  Skin: Positive for pallor and wound (very long incision on back). Negative for color change and rash.  Neurological: Positive for syncope, speech difficulty and weakness. Negative for dizziness, tremors, seizures, facial asymmetry, light-headedness, numbness and headaches.  Psychiatric/Behavioral: Positive for agitation, confusion, decreased concentration, hallucinations and sleep disturbance. Negative for behavioral problems.  All other systems reviewed and are negative.    There is no immunization history on file for this patient. Pertinent  Health Maintenance Due  Topic Date Due  . COLONOSCOPY  07/01/1999  . PNA vac Low Risk Adult (1 of 2 - PCV13) 06/30/2014  . INFLUENZA VACCINE  12/31/2015   No flowsheet data found. Functional Status Survey:    Vitals:   03/18/16 0600  BP: (!) 139/98  Pulse: 73  Resp: 16  Temp: 98 F (36.7 C)   There is no height or weight on file to calculate BMI. Physical Exam  Constitutional: Vital signs are normal. He appears well-developed and well-nourished. He appears lethargic. He is sleeping, active and uncooperative. He appears ill. No distress.  HENT:  Head: Normocephalic and atraumatic.  Mouth/Throat: Uvula is midline, oropharynx is  clear and moist and mucous membranes are normal. Mucous membranes are not pale, not dry and not cyanotic.  Eyes: Conjunctivae, EOM and lids are normal. Pupils are equal, round, and reactive to light.  Neck: Trachea normal, normal range of motion and full passive range of motion without pain. Neck supple. No JVD present. No tracheal deviation, no edema and no erythema present. No thyromegaly present.  Cardiovascular: Normal rate, regular rhythm, normal heart sounds, intact distal pulses and normal pulses.  Exam reveals no gallop, no distant heart sounds and no friction rub.   No murmur heard. Pulmonary/Chest: Effort normal and breath sounds normal. No accessory muscle usage. No respiratory distress. He has no decreased breath sounds. He has no wheezes. He has no rhonchi. He has no rales. He exhibits no tenderness.  Abdominal: Soft. Normal appearance. He exhibits no distension and no ascites. Bowel sounds are decreased. There is tenderness in the right lower quadrant, suprapubic area and left  lower quadrant.  Musculoskeletal: He exhibits no edema or tenderness.       Lumbar back: He exhibits decreased range of motion and laceration (long incision- healed). He exhibits no tenderness, no swelling, no edema and no pain.  Expected osteoarthritis, stiffness  Neurological: He appears lethargic. He is disoriented. He displays abnormal reflex. A cranial nerve deficit and sensory deficit is present. He exhibits abnormal muscle tone (rigidity). Coordination and gait abnormal. GCS eye subscore is 3. GCS verbal subscore is 2. GCS motor subscore is 4.  Skin: Skin is warm, dry and intact. He is not diaphoretic. No cyanosis. No pallor. Nails show no clubbing.     Psychiatric: Thought content normal. His affect is blunt. His speech is delayed and slurred. He is slowed and actively hallucinating. Cognition and memory are impaired. He expresses impulsivity. He is inattentive.  Nursing note and vitals reviewed.   Labs  reviewed:  Recent Labs  03/18/16 0445  NA 139  K 4.2  CL 102  CO2 31  GLUCOSE 104*  BUN 35*  CREATININE 1.40*  CALCIUM 9.0  MG 1.9    Recent Labs  03/18/16 0445  AST 57*  ALT 17  ALKPHOS 100  BILITOT 0.7  PROT 6.8  ALBUMIN 2.4*    Recent Labs  03/18/16 0445  WBC 6.8  NEUTROABS 2.4  HGB 10.5*  HCT 31.4*  MCV 86.3  PLT 262   Lab Results  Component Value Date   TSH 4.616 (H) 03/18/2016   Lab Results  Component Value Date   HGBA1C 5.8 (H) 09/14/2014   Lab Results  Component Value Date   CHOL 151 09/15/2014   HDL 54 09/15/2014   LDLCALC 86 09/15/2014   TRIG 57 09/15/2014   CHOLHDL 2.8 09/15/2014    Significant Diagnostic Results in last 30 days:  No results found.  Assessment/Plan 1. Urinary tract infection without hematuria, site unspecified 2. HCAP (healthcare-associated pneumonia)  Rocephin 2 gram IM x 1 now  Levaquin 750 mg IV Q day x 10 days  Insert and maintain peripheral IV    Family/ staff Communication:   Total Time: 105 minutes  Documentation: 60 minutes (Research)  Face to Face: 30 minutes  Family/Phone: 15 minutes   Labs/tests ordered:  Serum Ceruplasmin  Vikki Ports, NP-C Geriatrics [facility] [address],  [place] [address] Cell Phone (Mon-Fri 8am-5pm):  [phone] On Call:  [phone] & follow prompts after 5pm & weekends Office Phone:  [phone] Office Fax:  [phone]

## 2016-03-19 LAB — CERULOPLASMIN: Ceruloplasmin: 37.4 mg/dL — ABNORMAL HIGH (ref 16.0–31.0)

## 2016-03-19 LAB — VITAMIN D 25 HYDROXY (VIT D DEFICIENCY, FRACTURES): Vit D, 25-Hydroxy: 28.9 ng/mL — ABNORMAL LOW (ref 30.0–100.0)

## 2016-03-20 LAB — URINE CULTURE

## 2016-06-01 DEATH — deceased
